# Patient Record
Sex: Female | Born: 1980 | Race: White | Hispanic: No | Marital: Single | Smoking: Former smoker
Health system: Southern US, Community
[De-identification: ages and names within clinical notes are randomized; demographics above are authoritative.]

## PROBLEM LIST (undated history)

## (undated) DIAGNOSIS — N200 Calculus of kidney: Secondary | ICD-10-CM

## (undated) HISTORY — DX: Calculus of kidney: N20.0

## (undated) HISTORY — PX: OTHER SURGICAL HISTORY: SHX169

---

## 2015-05-21 ENCOUNTER — Other Ambulatory Visit: Payer: Self-pay | Admitting: Family Medicine

## 2015-05-21 DIAGNOSIS — N6310 Unspecified lump in the right breast, unspecified quadrant: Secondary | ICD-10-CM

## 2015-05-28 ENCOUNTER — Ambulatory Visit
Admission: RE | Admit: 2015-05-28 | Discharge: 2015-05-28 | Disposition: A | Discharge status: Home / Self Care | Payer: Medicaid Other | Source: Ambulatory Visit | Attending: Family Medicine | Admitting: Family Medicine

## 2015-05-28 DIAGNOSIS — N6310 Unspecified lump in the right breast, unspecified quadrant: Secondary | ICD-10-CM

## 2017-01-11 ENCOUNTER — Ambulatory Visit: Payer: Medicaid Other | Admitting: Obstetrics and Gynecology

## 2019-10-10 ENCOUNTER — Telehealth: Payer: Self-pay | Admitting: Obstetrics and Gynecology

## 2019-10-10 NOTE — Telephone Encounter (Signed)
3/1 at 210 for mirena with ABC

## 2019-10-10 NOTE — Telephone Encounter (Signed)
Noted. Will order to arrive by apt date/time. 

## 2019-10-23 ENCOUNTER — Other Ambulatory Visit: Payer: Self-pay

## 2019-10-23 ENCOUNTER — Ambulatory Visit (INDEPENDENT_AMBULATORY_CARE_PROVIDER_SITE_OTHER): Payer: Medicaid Other | Admitting: Obstetrics and Gynecology

## 2019-10-23 ENCOUNTER — Encounter: Payer: Self-pay | Admitting: Obstetrics and Gynecology

## 2019-10-23 VITALS — BP 120/80 | Ht 63.0 in | Wt 132.0 lb

## 2019-10-23 DIAGNOSIS — Z30433 Encounter for removal and reinsertion of intrauterine contraceptive device: Secondary | ICD-10-CM | POA: Diagnosis not present

## 2019-10-23 HISTORY — PX: SHOULDER ARTHROSCOPY WITH ROTATOR CUFF REPAIR: SHX5685

## 2019-10-23 MED ORDER — MIRENA (52 MG) 20 MCG/24HR IU IUD: INTRAUTERINE_SYSTEM | Freq: Once | INTRAUTERINE | 0 refills | Status: AC

## 2019-10-23 NOTE — Telephone Encounter (Signed)
Mirena reserved for this patient. 

## 2019-10-23 NOTE — Progress Notes (Signed)
Patient, No Pcp Per    Chief Complaint  Patient presents with  . Contraception    HPI:      [name] is a 39 y.o. No obstetric history on file. who LMP was No LMP recorded., presents today for NP Mirena replacement. Mirena placed about 5+ yrs ago. Pt is amenorrheic, no dysmen, no dyspareunia. Would like another one. Did Paragard in past with lots of bleeding. Has pap smears/annuals with PCP. They recommended pt see GYN for IUD mgmt.    Past Medical History:  Diagnosis Date  . Kidney stones     Past Surgical History:  Procedure Laterality Date  . kidney stones      History reviewed. No pertinent family history.  Social History   Socioeconomic History  . Marital status: Single    Spouse name: Not on file  . Number of children: Not on file  . Years of education: Not on file  . Highest education level: Not on file  Occupational History  . Not on file  Tobacco Use  . Smoking status: Never Smoker  . Smokeless tobacco: Never Used  Substance and Sexual Activity  . Alcohol use: Yes  . Drug use: Never  . Sexual activity: Yes    Birth control/protection: I.U.D.  Other Topics Concern  . Not on file  Social History Narrative  . Not on file   Social Determinants of Health   Financial Resource Strain:   . Difficulty of Paying Living Expenses: Not on file  Food Insecurity:   . Worried About Programme researcher, broadcasting/film/video in the Last Year: Not on file  . Ran Out of Food in the Last Year: Not on file  Transportation Needs:   . Lack of Transportation (Medical): Not on file  . Lack of Transportation (Non-Medical): Not on file  Physical Activity:   . Days of Exercise per Week: Not on file  . Minutes of Exercise per Session: Not on file  Stress:   . Feeling of Stress : Not on file  Social Connections:   . Frequency of Communication with Friends and Family: Not on file  . Frequency of Social Gatherings with Friends and Family: Not on file  . Attends Religious Services:  Not on file  . Active Member of Clubs or Organizations: Not on file  . Attends Banker Meetings: Not on file  . Marital Status: Not on file  Intimate Partner Violence:   . Fear of Current or Ex-Partner: Not on file  . Emotionally Abused: Not on file  . Physically Abused: Not on file  . Sexually Abused: Not on file    Outpatient Medications Prior to Visit  Medication Sig Dispense Refill  . amphetamine-dextroamphetamine (ADDERALL XR) 25 MG 24 hr capsule Adderall XR 25 mg capsule,extended release  TAKE 1 CAPSULE BY MOUTH EVERY MORNING    . levonorgestrel (MIRENA, 52 MG,) 20 MCG/24HR IUD Mirena 20 mcg/24 hours (6 yrs) 52 mg intrauterine device  Take by intrauterine route.     No facility-administered medications prior to visit.      ROS:  Review of Systems  Constitutional: Negative for fatigue, fever and unexpected weight change.  Respiratory: Negative for cough, shortness of breath and wheezing.   Cardiovascular: Negative for chest pain, palpitations and leg swelling.  Gastrointestinal: Negative for blood in stool, constipation, diarrhea, nausea and vomiting.  Endocrine: Negative for cold intolerance, heat intolerance and polyuria.  Genitourinary: Negative for dyspareunia, dysuria, flank pain, frequency, genital sores,  hematuria, menstrual problem, pelvic pain, urgency, vaginal bleeding, vaginal discharge and vaginal pain.  Musculoskeletal: Negative for back pain, joint swelling and myalgias.  Skin: Negative for rash.  Neurological: Negative for dizziness, syncope, light-headedness, numbness and headaches.  Hematological: Negative for adenopathy.  Psychiatric/Behavioral: Negative for agitation, confusion, sleep disturbance and suicidal ideas. The patient is not nervous/anxious.    OBJECTIVE:   Vitals:  BP 120/80   Ht 5\' 3"  (1.6 m)   Wt 132 lb (59.9 kg)   BMI 23.38 kg/m   Physical Exam Vitals reviewed.  Constitutional:      Appearance: She is well-developed.   Pulmonary:     Effort: Pulmonary effort is normal.  Genitourinary:    General: Normal vulva.     Pubic Area: No rash.      Labia:        Right: No rash, tenderness or lesion.        Left: No rash, tenderness or lesion.      Vagina: Normal. No vaginal discharge, erythema or tenderness.     Cervix: Normal.     Uterus: Normal. Not enlarged and not tender.      Adnexa: Right adnexa normal and left adnexa normal.       Right: No mass or tenderness.         Left: No mass or tenderness.       Comments: IUD STRINGS IN CX OS Musculoskeletal:        General: Normal range of motion.     Cervical back: Normal range of motion.  Skin:    General: Skin is warm and dry.  Neurological:     General: No focal deficit present.     Mental Status: She is alert and oriented to person, place, and time.  Psychiatric:        Mood and Affect: Mood normal.        Behavior: Behavior normal.        Thought Content: Thought content normal.        Judgment: Judgment normal.   IUD Removal Strings of IUD identified and grasped.  IUD removed without problem with ring forceps.  Pt tolerated this well.  IUD noted to be intact.  IUD Insertion Procedure Note Patient identified, informed consent performed, consent signed.   Discussed risks of irregular bleeding, cramping, infection, malpositioning or misplacement of the IUD outside the uterus which may require further procedure such as laparoscopy, risk of failure <1%. Time out was performed.    Speculum placed in the vagina.  Cervix visualized.  Cleaned with Betadine x 2.  Grasped anteriorly with a single tooth tenaculum.  Uterus sounded to 9.0 cm.   IUD placed per manufacturer's recommendations.  Strings trimmed to 3 cm. Tenaculum was removed, good hemostasis noted.  Patient tolerated procedure well.   Assessment/Plan: Encounter for removal and reinsertion of intrauterine contraceptive device (IUD) - Plan: levonorgestrel (MIRENA, 52 MG,) 20 MCG/24HR IUD   Meds  ordered this encounter  Medications  . levonorgestrel (MIRENA, 52 MG,) 20 MCG/24HR IUD    Sig: by Intrauterine route once for 1 dose.    Dispense:  1 each    Refill:  0    Order Specific Question:   Supervising Provider    Answer:   Nadara Mustard   Patient was given post-procedure instructions.  She was advised to have backup contraception for one week.   Call if you are having increasing pain, cramps or bleeding or if you have a  fever greater than 100.4 degrees F., shaking chills, nausea or vomiting. Patient was also asked to check IUD strings periodically and follow up in 4 weeks for IUD check.    Return in about 4 weeks (around 11/20/2019) for IUD f/u.  Loye Vento B. Ameli Sangiovanni, PA-C 10/23/2019 2:34 PM

## 2019-10-23 NOTE — Patient Instructions (Addendum)
I value your feedback and entrusting us with your care. If you get a [facility] patient survey, I would appreciate you taking the time to let us know about your experience today. Thank you!  As of August 03, 2019, your lab results will be released to your MyChart immediately, before I even have a chance to see them. Please give me time to review them and contact you if there are any abnormalities. Thank you for your patience.   [facility] [phone]  Instructions after IUD insertion  Most women experience no significant problems after insertion of an IUD, however minor cramping and spotting for a few days is common. Cramps may be treated with ibuprofen 800mg every 8 hours or Tylenol 650 mg every 4 hours. Contact [facility] immediately if you experience any of the following symptoms during the next week: temperature >99.6 degrees, worsening pelvic pain, abdominal pain, fainting, unusually heavy vaginal bleeding, foul vaginal discharge, or if you think you have expelled the IUD.  Nothing inserted in the vagina for 48 hours. You will be scheduled for a follow up visit in approximately four weeks.  You should check monthly to be sure you can feel the IUD strings in the upper vagina. If you are having a monthly period, try to check after each period. If you cannot feel the IUD strings,  contact [facility] immediately so we can do an exam to determine if the IUD has been expelled.   Please use backup protection until we can confirm the IUD is in place.  Call [facility] if you are exposed to or diagnosed with a sexually transmitted infection, as we will need to discuss whether it is safe for you to continue using an IUD.   

## 2019-11-20 ENCOUNTER — Ambulatory Visit (INDEPENDENT_AMBULATORY_CARE_PROVIDER_SITE_OTHER): Payer: Medicaid Other | Admitting: Obstetrics and Gynecology

## 2019-11-20 ENCOUNTER — Other Ambulatory Visit: Payer: Self-pay

## 2019-11-20 ENCOUNTER — Encounter: Payer: Self-pay | Admitting: Obstetrics and Gynecology

## 2019-11-20 VITALS — BP 104/80 | Ht 62.0 in | Wt 134.0 lb

## 2019-11-20 DIAGNOSIS — Z30431 Encounter for routine checking of intrauterine contraceptive device: Secondary | ICD-10-CM

## 2019-11-20 NOTE — Patient Instructions (Signed)
I value your feedback and entrusting us with your care. If you get a  patient survey, I would appreciate you taking the time to let us know about your experience today. Thank you!  As of August 03, 2019, your lab results will be released to your MyChart immediately, before I even have a chance to see them. Please give me time to review them and contact you if there are any abnormalities. Thank you for your patience.  

## 2019-11-20 NOTE — Progress Notes (Signed)
   Chief Complaint  Patient presents with  . IUD check    no concerns     History of Present Illness:  Cassandra Benitez is a 39 y.o. that had a Mirena IUD REplaced approximately 1 month ago. Since that time, she denies dyspareunia, pelvic pain, non-menstrual bleeding, vaginal d/c, heavy bleeding. Doing very well.   Review of Systems  Constitutional: Negative for fever.  Gastrointestinal: Negative for blood in stool, constipation, diarrhea, nausea and vomiting.  Genitourinary: Negative for dyspareunia, dysuria, flank pain, frequency, hematuria, urgency, vaginal bleeding, vaginal discharge and vaginal pain.  Musculoskeletal: Negative for back pain.  Skin: Negative for rash.    Physical Exam:  BP 104/80   Ht 5\' 2"  (1.575 m)   Wt 134 lb (60.8 kg)   BMI 24.51 kg/m  Body mass index is 24.51 kg/m.  Pelvic exam:  Two IUD strings present seen coming from the cervical os. EGBUS, vaginal vault and cervix: within normal limits   Assessment:   Encounter for routine checking of intrauterine contraceptive device (IUD)  IUD strings present in proper location; pt doing well  Plan: F/u if any signs of infection or can no longer feel the strings.   Alicia B. Copland, PA-C 11/20/2019 2:42 PM

## 2019-12-31 ENCOUNTER — Other Ambulatory Visit: Payer: Self-pay

## 2019-12-31 ENCOUNTER — Encounter (HOSPITAL_COMMUNITY): Payer: Self-pay

## 2019-12-31 DIAGNOSIS — K824 Cholesterolosis of gallbladder: Secondary | ICD-10-CM | POA: Diagnosis present

## 2019-12-31 DIAGNOSIS — D649 Anemia, unspecified: Secondary | ICD-10-CM | POA: Diagnosis present

## 2019-12-31 DIAGNOSIS — E876 Hypokalemia: Secondary | ICD-10-CM | POA: Diagnosis present

## 2019-12-31 DIAGNOSIS — F909 Attention-deficit hyperactivity disorder, unspecified type: Secondary | ICD-10-CM | POA: Diagnosis present

## 2019-12-31 DIAGNOSIS — J189 Pneumonia, unspecified organism: Secondary | ICD-10-CM | POA: Diagnosis present

## 2019-12-31 DIAGNOSIS — A403 Sepsis due to Streptococcus pneumoniae: Principal | ICD-10-CM | POA: Diagnosis present

## 2019-12-31 DIAGNOSIS — Z79899 Other long term (current) drug therapy: Secondary | ICD-10-CM

## 2019-12-31 DIAGNOSIS — E8770 Fluid overload, unspecified: Secondary | ICD-10-CM | POA: Diagnosis not present

## 2019-12-31 DIAGNOSIS — R739 Hyperglycemia, unspecified: Secondary | ICD-10-CM | POA: Diagnosis present

## 2019-12-31 DIAGNOSIS — E872 Acidosis: Secondary | ICD-10-CM | POA: Diagnosis present

## 2019-12-31 DIAGNOSIS — Z20822 Contact with and (suspected) exposure to covid-19: Secondary | ICD-10-CM | POA: Diagnosis present

## 2019-12-31 DIAGNOSIS — Z7289 Other problems related to lifestyle: Secondary | ICD-10-CM

## 2019-12-31 DIAGNOSIS — Z87442 Personal history of urinary calculi: Secondary | ICD-10-CM

## 2019-12-31 DIAGNOSIS — Z716 Tobacco abuse counseling: Secondary | ICD-10-CM

## 2019-12-31 DIAGNOSIS — Z975 Presence of (intrauterine) contraceptive device: Secondary | ICD-10-CM

## 2019-12-31 DIAGNOSIS — J9601 Acute respiratory failure with hypoxia: Secondary | ICD-10-CM | POA: Diagnosis present

## 2019-12-31 LAB — CBC
HCT: 40.4 % (ref 36.0–46.0)
Hemoglobin: 13.5 g/dL (ref 12.0–15.0)
MCH: 31.3 pg (ref 26.0–34.0)
MCHC: 33.4 g/dL (ref 30.0–36.0)
MCV: 93.5 fL (ref 80.0–100.0)
Platelets: 194 10*3/uL (ref 150–400)
RBC: 4.32 MIL/uL (ref 3.87–5.11)
RDW: 13 % (ref 11.5–15.5)
WBC: 28.2 10*3/uL — ABNORMAL HIGH (ref 4.0–10.5)
nRBC: 0 % (ref 0.0–0.2)

## 2019-12-31 LAB — URINALYSIS, ROUTINE W REFLEX MICROSCOPIC
Bilirubin Urine: NEGATIVE
Glucose, UA: NEGATIVE mg/dL
Ketones, ur: NEGATIVE mg/dL
Nitrite: NEGATIVE
Protein, ur: 30 mg/dL — AB
Specific Gravity, Urine: 1.024 (ref 1.005–1.030)
pH: 5 (ref 5.0–8.0)

## 2019-12-31 LAB — COMPREHENSIVE METABOLIC PANEL
ALT: 18 U/L (ref 0–44)
AST: 17 U/L (ref 15–41)
Albumin: 4.1 g/dL (ref 3.5–5.0)
Alkaline Phosphatase: 66 U/L (ref 38–126)
Anion gap: 10 (ref 5–15)
BUN: 13 mg/dL (ref 6–20)
CO2: 23 mmol/L (ref 22–32)
Calcium: 9.2 mg/dL (ref 8.9–10.3)
Chloride: 101 mmol/L (ref 98–111)
Creatinine, Ser: 0.87 mg/dL (ref 0.44–1.00)
GFR calc Af Amer: >60 mL/min (ref >60)
GFR calc non Af Amer: >60 mL/min (ref >60)
Glucose, Bld: 109 mg/dL — ABNORMAL HIGH (ref 70–99)
Potassium: 3.8 mmol/L (ref 3.5–5.1)
Sodium: 134 mmol/L — ABNORMAL LOW (ref 135–145)
Total Bilirubin: 0.8 mg/dL (ref 0.3–1.2)
Total Protein: 7.1 g/dL (ref 6.5–8.1)

## 2019-12-31 LAB — HCG, QUANTITATIVE, PREGNANCY: hCG, Beta Chain, Quant, S: <1 m[IU]/mL (ref <5)

## 2019-12-31 LAB — LIPASE, BLOOD: Lipase: 19 U/L (ref 11–51)

## 2019-12-31 NOTE — ED Triage Notes (Signed)
Pt reports RUQ abdominal pain that started this morning. Pt reports fever of 102 today and took Tylenol and Naproxen since then. Pt is afebrile in triage. Pt reports drinking 7 beers about every 2-3 days. Pt states her boyfriend thinks she has pancreatitis.

## 2020-01-01 ENCOUNTER — Emergency Department (HOSPITAL_COMMUNITY): Payer: Medicaid Other

## 2020-01-01 ENCOUNTER — Inpatient Hospital Stay (HOSPITAL_COMMUNITY)
Admission: EM | Admit: 2020-01-01 | Discharge: 2020-01-04 | DRG: 871 | Disposition: A | Discharge status: Home / Self Care | Payer: Medicaid Other | Attending: Family Medicine | Admitting: Family Medicine

## 2020-01-01 DIAGNOSIS — B955 Unspecified streptococcus as the cause of diseases classified elsewhere: Secondary | ICD-10-CM | POA: Diagnosis not present

## 2020-01-01 DIAGNOSIS — K824 Cholesterolosis of gallbladder: Secondary | ICD-10-CM | POA: Diagnosis present

## 2020-01-01 DIAGNOSIS — F909 Attention-deficit hyperactivity disorder, unspecified type: Secondary | ICD-10-CM | POA: Diagnosis present

## 2020-01-01 DIAGNOSIS — Z20822 Contact with and (suspected) exposure to covid-19: Secondary | ICD-10-CM | POA: Diagnosis present

## 2020-01-01 DIAGNOSIS — J9601 Acute respiratory failure with hypoxia: Secondary | ICD-10-CM | POA: Diagnosis present

## 2020-01-01 DIAGNOSIS — R1011 Right upper quadrant pain: Secondary | ICD-10-CM | POA: Diagnosis not present

## 2020-01-01 DIAGNOSIS — Z975 Presence of (intrauterine) contraceptive device: Secondary | ICD-10-CM | POA: Diagnosis not present

## 2020-01-01 DIAGNOSIS — Z716 Tobacco abuse counseling: Secondary | ICD-10-CM | POA: Diagnosis not present

## 2020-01-01 DIAGNOSIS — R0602 Shortness of breath: Secondary | ICD-10-CM

## 2020-01-01 DIAGNOSIS — J9621 Acute and chronic respiratory failure with hypoxia: Secondary | ICD-10-CM | POA: Diagnosis not present

## 2020-01-01 DIAGNOSIS — J189 Pneumonia, unspecified organism: Secondary | ICD-10-CM | POA: Diagnosis present

## 2020-01-01 DIAGNOSIS — Z79899 Other long term (current) drug therapy: Secondary | ICD-10-CM | POA: Diagnosis not present

## 2020-01-01 DIAGNOSIS — Z7289 Other problems related to lifestyle: Secondary | ICD-10-CM | POA: Diagnosis not present

## 2020-01-01 DIAGNOSIS — Z87442 Personal history of urinary calculi: Secondary | ICD-10-CM | POA: Diagnosis not present

## 2020-01-01 DIAGNOSIS — A403 Sepsis due to Streptococcus pneumoniae: Secondary | ICD-10-CM | POA: Diagnosis present

## 2020-01-01 DIAGNOSIS — E8770 Fluid overload, unspecified: Secondary | ICD-10-CM | POA: Diagnosis not present

## 2020-01-01 DIAGNOSIS — E872 Acidosis: Secondary | ICD-10-CM | POA: Diagnosis present

## 2020-01-01 DIAGNOSIS — R7881 Bacteremia: Secondary | ICD-10-CM | POA: Diagnosis not present

## 2020-01-01 DIAGNOSIS — D649 Anemia, unspecified: Secondary | ICD-10-CM | POA: Diagnosis present

## 2020-01-01 DIAGNOSIS — R739 Hyperglycemia, unspecified: Secondary | ICD-10-CM | POA: Diagnosis present

## 2020-01-01 DIAGNOSIS — A419 Sepsis, unspecified organism: Secondary | ICD-10-CM | POA: Diagnosis not present

## 2020-01-01 DIAGNOSIS — E876 Hypokalemia: Secondary | ICD-10-CM | POA: Diagnosis present

## 2020-01-01 LAB — BLOOD CULTURE ID PANEL (REFLEXED)

## 2020-01-01 LAB — LACTIC ACID, PLASMA: Lactic Acid, Venous: 1.7 mmol/L (ref 0.5–1.9)

## 2020-01-01 LAB — URINE CULTURE: Culture: NO GROWTH

## 2020-01-01 LAB — RESPIRATORY PANEL BY RT PCR (FLU A&B, COVID)
Influenza A by PCR: NEGATIVE
Influenza B by PCR: NEGATIVE
SARS Coronavirus 2 by RT PCR: NEGATIVE

## 2020-01-01 MED ORDER — ACETAMINOPHEN 650 MG RE SUPP: 650.0000 mg | Freq: Four times a day (QID) | RECTAL | Status: DC | PRN

## 2020-01-01 MED ORDER — SODIUM CHLORIDE 0.9% FLUSH
3.0000 mL | Freq: Two times a day (BID) | INTRAVENOUS | Status: DC
  Administered 2020-01-01 – 2020-01-04 (×4): 3 mL via INTRAVENOUS

## 2020-01-01 MED ORDER — SODIUM CHLORIDE 0.9 % IV SOLN
2.0000 g | INTRAVENOUS | Status: DC
  Administered 2020-01-02 – 2020-01-03 (×2): 2 g via INTRAVENOUS
  Filled 2020-01-01 (×2): qty 2

## 2020-01-01 MED ORDER — FENTANYL CITRATE (PF) 100 MCG/2ML IJ SOLN
100.0000 ug | Freq: Once | INTRAMUSCULAR | Status: AC
  Administered 2020-01-01: 02:00:00 100 ug via INTRAVENOUS
  Filled 2020-01-01: qty 2

## 2020-01-01 MED ORDER — IOHEXOL 300 MG/ML  SOLN
100.0000 mL | Freq: Once | INTRAMUSCULAR | Status: AC | PRN
  Administered 2020-01-01: 100 mL via INTRAVENOUS

## 2020-01-01 MED ORDER — POLYETHYLENE GLYCOL 3350 17 G PO PACK: 17.0000 g | PACK | Freq: Every day | ORAL | Status: DC | PRN

## 2020-01-01 MED ORDER — LACTATED RINGERS IV BOLUS (SEPSIS)
500.0000 mL | Freq: Once | INTRAVENOUS | Status: AC
  Administered 2020-01-01: 500 mL via INTRAVENOUS

## 2020-01-01 MED ORDER — ENOXAPARIN SODIUM 40 MG/0.4ML ~~LOC~~ SOLN
40.0000 mg | SUBCUTANEOUS | Status: DC
  Administered 2020-01-01 – 2020-01-03 (×3): 40 mg via SUBCUTANEOUS
  Filled 2020-01-01 (×3): qty 0.4

## 2020-01-01 MED ORDER — ACETAMINOPHEN 325 MG PO TABS
650.0000 mg | ORAL_TABLET | Freq: Four times a day (QID) | ORAL | Status: DC | PRN
  Administered 2020-01-01 – 2020-01-03 (×5): 650 mg via ORAL
  Filled 2020-01-01 (×5): qty 2

## 2020-01-01 MED ORDER — AZITHROMYCIN 250 MG PO TABS
500.0000 mg | ORAL_TABLET | Freq: Once | ORAL | Status: AC
  Administered 2020-01-01: 03:00:00 500 mg via ORAL
  Filled 2020-01-01: qty 2

## 2020-01-01 MED ORDER — LACTATED RINGERS IV BOLUS (SEPSIS)
250.0000 mL | Freq: Once | INTRAVENOUS | Status: AC
  Administered 2020-01-01: 250 mL via INTRAVENOUS

## 2020-01-01 MED ORDER — AMPHETAMINE-DEXTROAMPHET ER 5 MG PO CP24: 25.0000 mg | ORAL_CAPSULE | ORAL | Status: DC

## 2020-01-01 MED ORDER — GUAIFENESIN 100 MG/5ML PO SOLN
5.0000 mL | Freq: Once | ORAL | Status: AC
  Administered 2020-01-01: 100 mg via ORAL
  Filled 2020-01-01: qty 10

## 2020-01-01 MED ORDER — KETOROLAC TROMETHAMINE 15 MG/ML IJ SOLN
15.0000 mg | Freq: Four times a day (QID) | INTRAMUSCULAR | Status: AC | PRN
  Administered 2020-01-01 (×2): 15 mg via INTRAVENOUS
  Filled 2020-01-01 (×2): qty 1

## 2020-01-01 MED ORDER — GUAIFENESIN ER 600 MG PO TB12
1200.0000 mg | ORAL_TABLET | Freq: Two times a day (BID) | ORAL | Status: DC
  Administered 2020-01-01 – 2020-01-04 (×6): 1200 mg via ORAL
  Filled 2020-01-01 (×7): qty 2

## 2020-01-01 MED ORDER — KETOROLAC TROMETHAMINE 30 MG/ML IJ SOLN
30.0000 mg | Freq: Once | INTRAMUSCULAR | Status: AC
  Administered 2020-01-01: 30 mg via INTRAVENOUS
  Filled 2020-01-01: qty 1

## 2020-01-01 MED ORDER — SODIUM CHLORIDE 0.9 % IV BOLUS (SEPSIS)
1000.0000 mL | Freq: Once | INTRAVENOUS | Status: AC
  Administered 2020-01-01: 1000 mL via INTRAVENOUS

## 2020-01-01 MED ORDER — GUAIFENESIN 100 MG/5ML PO SOLN
5.0000 mL | ORAL | Status: DC | PRN
  Administered 2020-01-01 – 2020-01-03 (×4): 100 mg via ORAL
  Filled 2020-01-01 (×4): qty 10

## 2020-01-01 MED ORDER — SODIUM CHLORIDE 0.9 % IV SOLN: 500.0000 mg | INTRAVENOUS | Status: DC

## 2020-01-01 MED ORDER — SODIUM CHLORIDE 0.9 % IV SOLN: INTRAVENOUS | Status: AC

## 2020-01-01 MED ORDER — SODIUM CHLORIDE (PF) 0.9 % IJ SOLN
INTRAMUSCULAR | Status: AC
  Filled 2020-01-01: qty 50

## 2020-01-01 MED ORDER — ALBUTEROL SULFATE (2.5 MG/3ML) 0.083% IN NEBU: 2.5000 mg | INHALATION_SOLUTION | RESPIRATORY_TRACT | Status: DC | PRN

## 2020-01-01 MED ORDER — SODIUM CHLORIDE 0.9 % IV SOLN
2.0000 g | Freq: Once | INTRAVENOUS | Status: AC
  Administered 2020-01-01: 02:00:00 2 g via INTRAVENOUS
  Filled 2020-01-01: qty 20

## 2020-01-01 MED ORDER — METRONIDAZOLE IN NACL 5-0.79 MG/ML-% IV SOLN
500.0000 mg | Freq: Once | INTRAVENOUS | Status: AC
  Administered 2020-01-01: 03:00:00 500 mg via INTRAVENOUS
  Filled 2020-01-01: qty 100

## 2020-01-01 MED ORDER — LACTATED RINGERS IV BOLUS (SEPSIS)
1000.0000 mL | Freq: Once | INTRAVENOUS | Status: AC
  Administered 2020-01-01: 1000 mL via INTRAVENOUS

## 2020-01-01 MED ORDER — FENTANYL CITRATE (PF) 100 MCG/2ML IJ SOLN
100.0000 ug | Freq: Once | INTRAMUSCULAR | Status: AC
  Administered 2020-01-01: 05:00:00 100 ug via INTRAVENOUS
  Filled 2020-01-01: qty 2

## 2020-01-01 NOTE — H&P (Addendum)
History and Physical    Cassandra Benitez OBS:[id number] DOB: Sep 01, 1980 DOA: 01/01/2020  PCP: Lance Bosch, NP   I have briefly reviewed patients previous medical reports in [facility].  Patient coming from: Home  Chief Complaint: Right lower chest pain, productive cough, fever and body aches.  HPI: Cassandra Benitez is a 39 year old female, lives with family, does not currently work, independent, PMH of kidney stones that have required intervention in the past, ADHD but no other PMH, presented to the [facility] ED on 12/31/2019 due to above complaints.  Week prior to admission, she was ill with 3 days of URI symptoms that she caught from her 48-year-old child, recovered.  She received second dose of her Pfizer COVID-19 vaccine on 12/29/2019.  Since morning of 12/31/2019, she started experiencing right lower anterior chest pain which started off as mild to moderate then progressively got worse, radiating to right side of back, worse with deep inspiration or coughing, some relief with medications in the ED.  This was associated with productive cough with cream-colored phlegm, no blood.  Fevers as high as 102.6 at home.  Headache and generalized body aches.  She had some cramping right upper abdominal pain which she feels was due to her coughing which has since subsided.  Denies history of recent long distance travel.  Low index of suspicion for VTE at this time but if hypoxia persists despite recovery from pneumonia then may consider this given history of IUD.  ED Course: Temperature of 99.9, tachypneic in the 20s, tachycardia in the 100-1 tens, soft blood pressures in the 90s/50s-70s, mildly hypoxic with oxygen saturation in the low 90s.  Lab work significant for sodium of 134 otherwise CMP unremarkable.  WBC 28.2.  Lactate 1.7.  Beta hCG quantitative <1.  Flu and COVID-19 RT-PCR negative.  Urine microscopy shows rare bacteria, 30 protein, 21-50 white blood cell.  Blood and urine cultures  pending.  Chest x-ray confirms right middle lobe pneumonia. RUQ ultrasound: A 4 mm gallbladder polyp, otherwise unremarkable RUQ ultrasound.  Follow-up in 6 to 12 months recommended. CT abdomen and pelvis with contrast: Multifocal pneumonia (right middle lobe consolidation.  Patchy central consolidation in both lower lobes).  Trace pericholecystic fluid, likely incidental given otherwise normal RUQ ultrasound.  EDP evaluated initially for possible abdominal/gallbladder etiology but eventually turned out to be pneumonia. She was treated per sepsis protocol with IV fluids, IV ceftriaxone and oral azithromycin.  States that she feels about the same.  Review of Systems:  All other systems reviewed and apart from HPI, are negative.  Past Medical History:  Diagnosis Date  . Kidney stones     Past Surgical History:  Procedure Laterality Date  . kidney stones    . SHOULDER ARTHROSCOPY WITH ROTATOR CUFF REPAIR  10/2019    Social History  reports that she has never smoked. She has never used smokeless tobacco. She reports current alcohol use. She reports that she does not use drugs.  Patient waves, last done on 5/9.  She drinks a sixpack of beers up to 3 times per week.  No Known Allergies  History reviewed. No pertinent family history.  Her 11-year-old daughter had URI symptoms last week.   Prior to Admission medications   Medication Sig Start Date End Date Taking? Authorizing Provider  acetaminophen (TYLENOL) 325 MG tablet Take 325 mg by mouth every 6 (six) hours as needed for mild pain or headache.   Yes [provider]  amphetamine-dextroamphetamine (ADDERALL XR) 25 MG  24 hr capsule Take 25 mg by mouth every morning.    Yes [provider]  levonorgestrel (MIRENA, 52 MG,) 20 MCG/24HR IUD by Intrauterine route once for 1 dose. 10/23/19 01/01/20 Yes CoplandIlona Sorrel, PA-C    Physical Exam: Vitals:   01/01/20 0700 01/01/20 0730 01/01/20 0800 01/01/20 0851  BP: 99/70  100/65 102/68   Pulse: (!) 113 (!) 112 (!) 118   Resp: (!) 25 (!) 26 (!) 24   Temp:    99.4 F (37.4 C)  TempSrc:    Oral  SpO2: 90% 91% 90%   Weight:      Height:       Patient was examined along with the female RN as chaperone in the room.   Constitutional: Pleasant young female, moderately built and nourished, slightly ill looking lying propped up in bed. Eyes: PERTLA, lids and conjunctivae normal ENMT: Mucous membranes are dry. Posterior pharynx clear of any exudate or lesions. Normal dentition.  Neck: supple, no masses, no thyromegaly Respiratory: Bronchial breath sounds bilateral bases, right greater than sign left with occasional crackles.  No wheezing or rhonchi.  Rest of lung fields clear to auscultation.  Mild tachypnea. Cardiovascular: S1 & S2 heard, mild regular tachycardia. No JVD, murmurs, rubs or clicks. No pedal edema. Abdomen: Non distended. Non tender. Soft. No organomegaly or masses appreciated. No clinical Ascites. Normal bowel sounds heard. Musculoskeletal: no clubbing / cyanosis. No joint deformity upper and lower extremities. Good ROM, no contractures. Normal muscle tone.  Skin: no rashes, lesions, ulcers. No induration Neurologic: CN 2-12 grossly intact. Sensation intact, DTR normal. Strength 5/5 in all 4 limbs.  Psychiatric: Normal judgment and insight. Alert and oriented x 3. Normal mood.     Labs on Admission: I have personally reviewed following labs and imaging studies  CBC: Recent Labs  Lab 12/31/19 2246  WBC 28.2*  HGB 13.5  HCT 40.4  MCV 93.5  PLT 194    Basic Metabolic Panel: Recent Labs  Lab 12/31/19 2246  NA 134*  K 3.8  CL 101  CO2 23  GLUCOSE 109*  BUN 13  CREATININE 0.87  CALCIUM 9.2    Liver Function Tests: Recent Labs  Lab 12/31/19 2246  AST 17  ALT 18  ALKPHOS 66  BILITOT 0.8  PROT 7.1  ALBUMIN 4.1    Urine analysis:    Component Value Date/Time   COLORURINE AMBER (A) 12/31/2019 2246   APPEARANCEUR CLOUDY  (A) 12/31/2019 2246   LABSPEC 1.024 12/31/2019 2246   PHURINE 5.0 12/31/2019 2246   GLUCOSEU NEGATIVE 12/31/2019 2246   HGBUR SMALL (A) 12/31/2019 2246   BILIRUBINUR NEGATIVE 12/31/2019 2246   KETONESUR NEGATIVE 12/31/2019 2246   PROTEINUR 30 (A) 12/31/2019 2246   NITRITE NEGATIVE 12/31/2019 2246   LEUKOCYTESUR SMALL (A) 12/31/2019 2246     Radiological Exams on Admission: CT ABDOMEN PELVIS W CONTRAST  Result Date: 01/01/2020 CLINICAL DATA:  Right upper quadrant pain. EXAM: CT ABDOMEN AND PELVIS WITH CONTRAST TECHNIQUE: Multidetector CT imaging of the abdomen and pelvis was performed using the standard protocol following bolus administration of intravenous contrast. CONTRAST:  OMNIPAQUE IOHEXOL 300 MG/ML  SOLN COMPARISON:  Right upper quadrant ultrasound from same day. FINDINGS: Lower chest: Right middle lobe consolidation. Patchy central consolidations in both lower lobes. Hepatobiliary: No focal liver abnormality is seen. Trace pericholecystic fluid. No gallstones, gallbladder wall thickening, or biliary dilatation. Pancreas: Unremarkable. No pancreatic ductal dilatation or surrounding inflammatory changes. Spleen: Normal in size without focal  abnormality. Adrenals/Urinary Tract: Adrenal glands are unremarkable. Kidneys are normal, without renal calculi, focal lesion, or hydronephrosis. Bladder is decompressed. Stomach/Bowel: Stomach is within normal limits. Appendix appears normal. No evidence of bowel wall thickening, distention, or inflammatory changes. Vascular/Lymphatic: No significant vascular findings are present. No enlarged abdominal or pelvic lymph nodes. Reproductive: Uterus and bilateral adnexa are unremarkable. IUD within the endometrial canal. Other: Trace free fluid in the pelvis, likely physiologic. No pneumoperitoneum. Musculoskeletal: No acute or significant osseous findings. IMPRESSION: 1. Multifocal pneumonia. 2. Trace pericholecystic fluid, likely incidental given  otherwise normal right upper quadrant ultrasound from earlier this morning. Electronically Signed   By: Titus Dubin M.D.   On: 01/01/2020 05:49   DG Chest Port 1 View  Result Date: 01/01/2020 CLINICAL DATA:  39 year old female with cough. EXAM: PORTABLE CHEST 1 VIEW COMPARISON:  None FINDINGS: Focal right middle lobe density with silhouetting of the right cardiac border. The left lung is clear. No pleural effusion or pneumothorax. The cardiac silhouette is within limits. No acute osseous pathology. IMPRESSION: Right middle lobe pneumonia. Electronically Signed   By: Anner Crete M.D.   On: 01/01/2020 01:37   US Abdomen Limited RUQ  Result Date: 01/01/2020 CLINICAL DATA:  39 year old female with right upper quadrant abdominal pain. EXAM: ULTRASOUND ABDOMEN LIMITED RIGHT UPPER QUADRANT COMPARISON:  None. FINDINGS: Gallbladder: There is no gallstone, gallbladder wall thickening, or pericholecystic fluid. Negative sonographic Murphy's sign. There is a 4 mm gallbladder polyp. Common bile duct: Diameter: 2 mm Liver: No focal lesion identified. Within normal limits in parenchymal echogenicity. Portal vein is patent on color Doppler imaging with normal direction of blood flow towards the liver. Other: None. IMPRESSION: A 4 mm gallbladder polyp, otherwise unremarkable right upper quadrant ultrasound. Follow-up in 6-12 months recommended. Electronically Signed   By: Anner Crete M.D.   On: 01/01/2020 02:18    EKG: Independently reviewed.  Sinus tachycardia at 116 bpm, normal axis, no acute findings and QTC of 438 ms.  Assessment/Plan Principal Problem:   Sepsis due to pneumonia [facility]) Active Problems:   CAP (community acquired pneumonia)   ADHD   Acute on chronic respiratory failure with hypoxia (HCC)     Sepsis, POA, due to lobar/community-acquired pneumonia: Treated per sepsis protocol with aggressive IV fluids, IV ceftriaxone and azithromycin, continue.  Follow blood and urine culture  results.  Treat supportively with antipyretics, incentive spirometry, oxygen support, antipyretics/NSAIDs.  Counseled regarding quitting vaping and she verbalized understanding.  Acute respiratory failure with hypoxia: Secondary to pneumonia.  Oxygen support.  Need to determine oxygen needs prior to discharge.  ADHD: Continue home dose of Adderall.  Stable.  Vaping: Cessation counseled.  Alcohol use: Mild.  If develops any withdrawal symptoms or signs then may consider CIWA protocol.  Leukocytosis: Secondary to sepsis from pneumonia.  Trend daily CBC.   DVT prophylaxis: Lovenox Code Status: Full Family Communication: None at bedside Disposition Plan:   Patient is from:  Home  Anticipated DC to:  Home  Anticipated DC date:  To be determined, possibly 2 to 3 days  Anticipated DC barriers: Improvement from sepsis related to pneumonia and from hypoxia.   Consults called: None Admission status: Inpatient/telemetry  Severity of Illness: The appropriate patient status for this patient is INPATIENT. Inpatient status is judged to be reasonable and necessary in order to provide the required intensity of service to ensure the patient's safety. The patient's presenting symptoms, physical exam findings, and initial radiographic and laboratory data in the context of their  chronic comorbidities is felt to place them at high risk for further clinical deterioration. Furthermore, it is not anticipated that the patient will be medically stable for discharge from the hospital within 2 midnights of admission. The following factors support the patient status of inpatient.   " The patient's presenting symptoms include right lower pleuritic chest pain, productive cough, fever, myalgia and headache. " The worrisome physical exam findings include fever, tachycardia, tachypnea, hypoxia, bronchial breath sounds in lower lobes, right more than left. " The initial radiographic and laboratory data are worrisome  because of CT abdomen and chest x-ray suggestive of pneumonia, marked leukocytosis. " The chronic co-morbidities include ADHD.   * I certify that at the point of admission it is my clinical judgment that the patient will require inpatient hospital care spanning beyond 2 midnights from the point of admission due to high intensity of service, high risk for further deterioration and high frequency of surveillance required.Marcellus Scott MD [facility]  To contact the attending provider between 7A-7P or the covering provider during after hours 7P-7A, please log into the web site [email or web address] and access using universal [place] password for that web site. If you do not have the password, please call the hospital operator.  01/01/2020, 8:59 AM

## 2020-01-01 NOTE — Progress Notes (Signed)
PHARMACY - PHYSICIAN COMMUNICATION CRITICAL VALUE ALERT - BLOOD CULTURE IDENTIFICATION (BCID)  Cassandra Benitez is an 39 y.o. female who presented to [facility] on 01/01/2020 with a chief complaint of  Right lower chest pain, productive cough, fever and body aches  Assessment: PNA, strep pneumo bacteremia  Name of physician (or Provider) Contacted: Modena Nunnery via [facility]  Current antibiotics: ceftriaxone 2 gm IV q24, azithromycin 500 mg IV q24  Changes to prescribed antibiotics recommended:  rec continue ceftriaxone 2 gm q24 and stop azithromycin  Results for orders placed or performed during the hospital encounter of 01/01/20  Blood Culture ID Panel (Reflexed) (Collected: 01/01/2020  1:51 AM)  Result Value Ref Range   Enterococcus species NOT DETECTED NOT DETECTED   Listeria monocytogenes NOT DETECTED NOT DETECTED   Staphylococcus species NOT DETECTED NOT DETECTED   Staphylococcus aureus (BCID) NOT DETECTED NOT DETECTED   Streptococcus species DETECTED (A) NOT DETECTED   Streptococcus agalactiae NOT DETECTED NOT DETECTED   Streptococcus pneumoniae DETECTED (A) NOT DETECTED   Streptococcus pyogenes NOT DETECTED NOT DETECTED   Acinetobacter baumannii NOT DETECTED NOT DETECTED   Enterobacteriaceae species NOT DETECTED NOT DETECTED   Enterobacter cloacae complex NOT DETECTED NOT DETECTED   Escherichia coli NOT DETECTED NOT DETECTED   Klebsiella oxytoca NOT DETECTED NOT DETECTED   Klebsiella pneumoniae NOT DETECTED NOT DETECTED   Proteus species NOT DETECTED NOT DETECTED   Serratia marcescens NOT DETECTED NOT DETECTED   Haemophilus influenzae NOT DETECTED NOT DETECTED   Neisseria meningitidis NOT DETECTED NOT DETECTED   Pseudomonas aeruginosa NOT DETECTED NOT DETECTED   Candida albicans NOT DETECTED NOT DETECTED   Candida glabrata NOT DETECTED NOT DETECTED   Candida krusei NOT DETECTED NOT DETECTED   Candida parapsilosis NOT DETECTED NOT DETECTED   Candida tropicalis NOT DETECTED NOT  DETECTED    Herby Abraham, Pharm.D [phone] 01/01/2020 9:21 PM

## 2020-01-01 NOTE — ED Notes (Signed)
Attempted to call report to 5E, this RN was not available at the time scheduled to call report D/T emergent situation.

## 2020-01-01 NOTE — Progress Notes (Signed)
Patient is a yellow MEWS because her heart rate is between 110-120 and respirations are between 20-31, this is not an acute change. MEWS protocol is already in place.

## 2020-01-01 NOTE — ED Notes (Signed)
Report given to 5E RN, Britta Mccreedy.

## 2020-01-01 NOTE — ED Notes (Signed)
Pt o2 saturation decreased to 87% on RA pt placed on 2L [place] and increased to 95%.

## 2020-01-01 NOTE — ED Notes (Signed)
Pt ambulated to restroom without assistance.

## 2020-01-01 NOTE — ED Provider Notes (Signed)
[facility] DEPT Provider Note   CSN: [id number] Arrival date & time: 12/31/19  2222     History Chief Complaint  Patient presents with  . Abdominal Pain  . Fever    Cassandra Benitez is a 39 y.o. female.  The history is provided by the patient.  Abdominal Pain Pain location:  RUQ Pain quality: aching   Pain radiates to:  R shoulder Pain severity:  Moderate Timing:  Intermittent Progression:  Worsening Chronicity:  New Relieved by:  Nothing Worsened by:  Palpation, movement and coughing Associated symptoms: chills, cough and fever   Associated symptoms: no vomiting   Fever Associated symptoms: chills and cough   Associated symptoms: no vomiting   Patient with previous history of kidney stones presents with upper abdominal pain.  She reports throughout the day she has been having fevers, chills, right upper quadrant abdominal pain.  The pain at times will go into her chest and her shoulder.  She reports recent cough and cold symptoms since last week that are also worsening.  No vomiting or diarrhea.  She has not been tested for COVID-19.  She did receive her second vaccine over 48 hours ago. She has never had this pain before.  She has a distant history of kidney stones requiring nephrostomy tube Patient reports frequent alcohol use but not a daily drinker    Past Medical History:  Diagnosis Date  . Kidney stones     There are no problems to display for this patient.   Past Surgical History:  Procedure Laterality Date  . kidney stones    . SHOULDER ARTHROSCOPY WITH ROTATOR CUFF REPAIR  10/2019     OB History    Gravida  3   Para  1   Term  1   Preterm      AB  1   Living  1     SAB  1   TAB      Ectopic      Multiple      Live Births              History reviewed. No pertinent family history.  Social History   Tobacco Use  . Smoking status: Never Smoker  . Smokeless tobacco: Never Used  Substance Use  Topics  . Alcohol use: Yes  . Drug use: Never    Home Medications Prior to Admission medications   Medication Sig Start Date End Date Taking? Authorizing Provider  amphetamine-dextroamphetamine (ADDERALL XR) 25 MG 24 hr capsule Adderall XR 25 mg capsule,extended release  TAKE 1 CAPSULE BY MOUTH EVERY MORNING    [provider]  cyclobenzaprine (FLEXERIL) 10 MG tablet cyclobenzaprine 10 mg tablet  TAKE 1 TABLET(S) EVERY 6 8 HOURS AS NEEDED FOR SPASM    [provider]  levonorgestrel (MIRENA, 52 MG,) 20 MCG/24HR IUD by Intrauterine route once for 1 dose. 10/23/19 08/26/43  Copland, Deirdre Evener, PA-C  naproxen (NAPROSYN) 500 MG tablet naproxen 500 mg tablet  TAKE 1 TABLET BY MOUTH TWICE A DAY    [provider]  ondansetron (ZOFRAN) 4 MG tablet ondansetron HCl 4 mg tablet  TAKE 1 TABLET EVERY 6 8 HOURS AS NEEDED FOR POST OP NAUSEA    [provider]  oxyCODONE-acetaminophen (PERCOCET/ROXICET) 5-325 MG tablet oxycodone-acetaminophen 5 mg-325 mg tablet  TAKE 1 TABLET BY MOUTH EVERY 4 TO 6 HOURS AS NEEDED    [provider]    Allergies    Patient has no  known allergies.  Review of Systems   Review of Systems  Constitutional: Positive for chills and fever.  Respiratory: Positive for cough.   Gastrointestinal: Positive for abdominal pain. Negative for vomiting.  All other systems reviewed and are negative.   Physical Exam Updated Vital Signs BP 107/77   Pulse (!) 116   Temp 98.7 F (37.1 C) (Oral) Comment: 500 mg Tylenol @ 18:00   Resp (!) 26   Ht 1.575 m (5\' 2" )   Wt 56.7 kg   SpO2 97%   BMI 22.86 kg/m   Physical Exam CONSTITUTIONAL: Well developed/well nourished HEAD: Normocephalic/atraumatic EYES: EOMI/PERRL ENMT: Mucous membranes moist NECK: supple no meningeal signs SPINE/BACK:entire spine nontender CV: S1/S2 noted, no murmurs/rubs/gallops noted LUNGS: Lungs are clear to auscultation bilaterally, no apparent distress, coughs  frequently on exam ABDOMEN: soft, + RUQ tenderness, no rebound or guarding, bowel sounds noted throughout abdomen GU:no cva tenderness NEURO: Pt is awake/alert/appropriate, moves all extremitiesx4.  No facial droop.  EXTREMITIES: pulses normal/equal, full ROM SKIN: warm, color normal PSYCH: no abnormalities of mood noted, alert and oriented to situation  ED Results / Procedures / Treatments   Labs (all labs ordered are listed, but only abnormal results are displayed) Labs Reviewed  COMPREHENSIVE METABOLIC PANEL - Abnormal; Notable for the following components:      Result Value   Sodium 134 (*)    Glucose, Bld 109 (*)    All other components within normal limits  CBC - Abnormal; Notable for the following components:   WBC 28.2 (*)    All other components within normal limits  URINALYSIS, ROUTINE W REFLEX MICROSCOPIC - Abnormal; Notable for the following components:   Color, Urine AMBER (*)    APPearance CLOUDY (*)    Hgb urine dipstick SMALL (*)    Protein, ur 30 (*)    Leukocytes,Ua SMALL (*)    Bacteria, UA RARE (*)    All other components within normal limits  RESPIRATORY PANEL BY RT PCR (FLU A&B, COVID)  CULTURE, BLOOD (ROUTINE X 2)  CULTURE, BLOOD (ROUTINE X 2)  URINE CULTURE  LIPASE, BLOOD  HCG, QUANTITATIVE, PREGNANCY  LACTIC ACID, PLASMA    EKG EKG Interpretation  Date/Time:  Monday Jan 01 2020 01:21:51 EDT Ventricular Rate:  116 PR Interval:    QRS Duration: 89 QT Interval:  315 QTC Calculation: 438 R Axis:   59 Text Interpretation: Sinus tachycardia Probable left atrial enlargement No previous ECGs available Confirmed by [id number] (Zadie Rhine) on 01/01/2020 1:30:21 AM   Radiology CT ABDOMEN PELVIS W CONTRAST  Result Date: 01/01/2020 CLINICAL DATA:  Right upper quadrant pain. EXAM: CT ABDOMEN AND PELVIS WITH CONTRAST TECHNIQUE: Multidetector CT imaging of the abdomen and pelvis was performed using the standard protocol following bolus administration of  intravenous contrast. CONTRAST:  03/02/2020 OMNIPAQUE IOHEXOL 300 MG/ML  SOLN COMPARISON:  Right upper quadrant ultrasound from same day. FINDINGS: Lower chest: Right middle lobe consolidation. Patchy central consolidations in both lower lobes. Hepatobiliary: No focal liver abnormality is seen. Trace pericholecystic fluid. No gallstones, gallbladder wall thickening, or biliary dilatation. Pancreas: Unremarkable. No pancreatic ductal dilatation or surrounding inflammatory changes. Spleen: Normal in size without focal abnormality. Adrenals/Urinary Tract: Adrenal glands are unremarkable. Kidneys are normal, without renal calculi, focal lesion, or hydronephrosis. Bladder is decompressed. Stomach/Bowel: Stomach is within normal limits. Appendix appears normal. No evidence of bowel wall thickening, distention, or inflammatory changes. Vascular/Lymphatic: No significant vascular findings are present. No enlarged abdominal or pelvic lymph nodes. Reproductive: Uterus  and bilateral adnexa are unremarkable. IUD within the endometrial canal. Other: Trace free fluid in the pelvis, likely physiologic. No pneumoperitoneum. Musculoskeletal: No acute or significant osseous findings. IMPRESSION: 1. Multifocal pneumonia. 2. Trace pericholecystic fluid, likely incidental given otherwise normal right upper quadrant ultrasound from earlier this morning. Electronically Signed   By: Obie Dredge M.D.   On: 01/01/2020 05:49   DG Chest Port 1 View  Result Date: 01/01/2020 CLINICAL DATA:  39 year old female with cough. EXAM: PORTABLE CHEST 1 VIEW COMPARISON:  None FINDINGS: Focal right middle lobe density with silhouetting of the right cardiac border. The left lung is clear. No pleural effusion or pneumothorax. The cardiac silhouette is within limits. No acute osseous pathology. IMPRESSION: Right middle lobe pneumonia. Electronically Signed   By: Elgie Collard M.D.   On: 01/01/2020 01:37   US Abdomen Limited RUQ  Result Date:  01/01/2020 CLINICAL DATA:  39 year old female with right upper quadrant abdominal pain. EXAM: ULTRASOUND ABDOMEN LIMITED RIGHT UPPER QUADRANT COMPARISON:  None. FINDINGS: Gallbladder: There is no gallstone, gallbladder wall thickening, or pericholecystic fluid. Negative sonographic Murphy's sign. There is a 4 mm gallbladder polyp. Common bile duct: Diameter: 2 mm Liver: No focal lesion identified. Within normal limits in parenchymal echogenicity. Portal vein is patent on color Doppler imaging with normal direction of blood flow towards the liver. Other: None. IMPRESSION: A 4 mm gallbladder polyp, otherwise unremarkable right upper quadrant ultrasound. Follow-up in 6-12 months recommended. Electronically Signed   By: Elgie Collard M.D.   On: 01/01/2020 02:18    Procedures .Critical Care Performed by: Zadie Rhine, MD Authorized by: Zadie Rhine, MD   Critical care provider statement:    Critical care time (minutes):  60   Critical care start time:  01/01/2020 4:30 AM   Critical care end time:  01/01/2020 5:30 AM   Critical care time was exclusive of:  Separately billable procedures and treating other patients   Critical care was necessary to treat or prevent imminent or life-threatening deterioration of the following conditions:  Sepsis, dehydration and respiratory failure   Critical care was time spent personally by me on the following activities:  Ordering and performing treatments and interventions, ordering and review of laboratory studies, ordering and review of radiographic studies, pulse oximetry, re-evaluation of patient's condition, evaluation of patient's response to treatment, examination of patient, development of treatment plan with patient or surrogate and discussions with consultants   I assumed direction of critical care for this patient from another provider in my specialty: no       Medications Ordered in ED Medications  sodium chloride (PF) 0.9 % injection (has no  administration in time range)  ketorolac (TORADOL) 30 MG/ML injection 30 mg (has no administration in time range)  lactated ringers bolus 1,000 mL (0 mLs Intravenous Stopped 01/01/20 0231)    And  lactated ringers bolus 500 mL (0 mLs Intravenous Stopped 01/01/20 0315)    And  lactated ringers bolus 250 mL (0 mLs Intravenous Stopped 01/01/20 0320)  cefTRIAXone (ROCEPHIN) 2 g in sodium chloride 0.9 % 100 mL IVPB (0 g Intravenous Stopped 01/01/20 0231)  metroNIDAZOLE (FLAGYL) IVPB 500 mg (0 mg Intravenous Stopped 01/01/20 0330)  fentaNYL (SUBLIMAZE) injection 100 mcg (100 mcg Intravenous Given 01/01/20 0136)  azithromycin (ZITHROMAX) tablet 500 mg (500 mg Oral Given 01/01/20 0255)  guaiFENesin (ROBITUSSIN) 100 MG/5ML solution 100 mg (100 mg Oral Given 01/01/20 0323)  sodium chloride 0.9 % bolus 1,000 mL (1,000 mLs Intravenous New Bag/Given 01/01/20 0520)  fentaNYL (SUBLIMAZE) injection 100 mcg (100 mcg Intravenous Given 01/01/20 0508)  iohexol (OMNIPAQUE) 300 MG/ML solution 100 mL (100 mLs Intravenous Contrast Given 01/01/20 0455)    ED Course  I have reviewed the triage vital signs and the nursing notes.  Pertinent labs & imaging results that were available during my care of the patient were reviewed by me and considered in my medical decision making (see chart for details).    MDM Rules/Calculators/A&P                       This patient presents to the ED for concern of fever, cough, abdominal pain, this involves an extensive number of treatment options, and is a complaint that carries with it a high risk of complications and morbidity.  The differential diagnosis includes pneumonia, cholecystitis, kidney stone, COVID-19  Patient had focal right upper quadrant tenderness as well as cough and pleuritic pain in her right chest from coughing.  It was difficult to discern on exam if this was pneumonia versus cholecystitis.  Chest x-ray is consistent with pneumonia.  Work-up is pending at this time 4:31  AM Patient worsening.  Patient tachycardic to 130s now reporting diffuse abdominal pain. Initially it was felt that her upper abdomen and chest wall pain were from pneumonia, but now reports diffuse abdominal pain and flank pain.  We will need to obtain CT abdomen pelvis and admit patient.  IV fluids also ordered  6:51 AM CT scan shows no acute abdominal findings, patient has multifocal pneumonia Patient now has an oxygen requirement and pulse ox was dropped to low 90s.  Patient still tachycardic. Patient will be admitted to the hospital.  Discussed with Dr. Loney Loh for admission  Lab Tests:   I Ordered, reviewed, and interpreted labs, which included LFTs, electrolytes, CBC, lactate, urinalysis  Medicines ordered:   I ordered medication fluids, antibiotics, pain medication for possible sepsis  Imaging Studies ordered:   I ordered imaging studies which included chest x-ray and right upper quadrant ultrasound, CT abd/pelvis  I independently visualized and interpreted imaging which showed gallbladder polyp and pneumonia    Consultations Obtained:   I consulted [facility] hospital[facility] and discussed lab and imaging findings  Reevaluation:  After the interventions stated above, I reevaluated the patient and found patient is stable  Critical Interventions:  . IV fluids and IV antibiotics   Daanya Lanphier was evaluated in Emergency Department on 01/01/2020 for the symptoms described in the history of present illness. She was evaluated in the context of the global COVID-19 pandemic, which necessitated consideration that the patient might be at risk for infection with the SARS-CoV-2 virus that causes COVID-19. Institutional protocols and algorithms that pertain to the evaluation of patients at risk for COVID-19 are in a state of rapid change based on information released by regulatory bodies including the CDC and federal and state organizations. These policies and algorithms were followed  during the patient's care in the ED.  Final Clinical Impression(s) / ED Diagnoses Final diagnoses:  RUQ pain  Community acquired pneumonia of right middle lobe of lung  Gallbladder polyp    Rx / DC Orders ED Discharge Orders    None       Zadie Rhine, MD 01/01/20 [phone]

## 2020-01-01 NOTE — Discharge Instructions (Signed)
You will need a follow-up ultrasound of your gallbladder in 1 year to evaluate for the polyp

## 2020-01-01 NOTE — Progress Notes (Addendum)
Patient came up from ED in the yellow mews. Charge nurse Christena Deem was in the room and completed patient initial assessment. Per Dahlia Client notified Dr. Waymon Amato by [facility] page. Dr. Waymon Amato called back w/ no new orders. Will gave PRN toradol per Christena Deem, RN. Patient is laying in bed and is A&Ox4.   VS 1109 T 100.0 BP 108/67 HR 107 RR 21 O2 94% on 3 L / [place]

## 2020-01-01 NOTE — Progress Notes (Signed)
Addendum  Was paged earlier this afternoon that patient had Red MEWS.  I immediately called the nurse.  She indicated that apart from fever and associated tachypnea and tachycardia, patient was otherwise not in distress and stable.  She had alerted rapid response. She had just given her Tylenol.  I advised her to closely monitor her and call back if does not improve or worsens.  I was in the process of urgently transferring the patient from ED to [facility].   I followed up with RN just now, patient has improved and currently in green MEWS.  Continue close monitoring and management.  Marcellus Scott, MD, [place], [facility]. [facility]  To contact the attending provider between 7A-7P or the covering provider during after hours 7P-7A, please log into the web site [email or web address] and access using universal [place] password for that web site. If you do not have the password, please call the hospital operator.'

## 2020-01-01 NOTE — Progress Notes (Signed)
Patient is now in the RED MEWS. Notified charge nurse Christena Deem, Rapid Response Nurse Zoe, Dr. Waymon Amato. Gave PRN tylenol and will recheck temp. No new orders from Dr. Waymon Amato. He did recommend ice packs for patient.   Temp 101.6 BP 107/70 RR 31 HR 107 95 % on 3L nasal cannula.   [facility], RN came and assessed the patient.

## 2020-01-02 DIAGNOSIS — R7881 Bacteremia: Secondary | ICD-10-CM

## 2020-01-02 DIAGNOSIS — B955 Unspecified streptococcus as the cause of diseases classified elsewhere: Secondary | ICD-10-CM

## 2020-01-02 LAB — COMPREHENSIVE METABOLIC PANEL
ALT: 13 U/L (ref 0–44)
AST: 12 U/L — ABNORMAL LOW (ref 15–41)
Albumin: 2.6 g/dL — ABNORMAL LOW (ref 3.5–5.0)
Alkaline Phosphatase: 62 U/L (ref 38–126)
Anion gap: 7 (ref 5–15)
BUN: 8 mg/dL (ref 6–20)
CO2: 21 mmol/L — ABNORMAL LOW (ref 22–32)
Calcium: 7.8 mg/dL — ABNORMAL LOW (ref 8.9–10.3)
Chloride: 107 mmol/L (ref 98–111)
Creatinine, Ser: 0.8 mg/dL (ref 0.44–1.00)
GFR calc Af Amer: >60 mL/min (ref >60)
GFR calc non Af Amer: >60 mL/min (ref >60)
Glucose, Bld: 108 mg/dL — ABNORMAL HIGH (ref 70–99)
Potassium: 3.3 mmol/L — ABNORMAL LOW (ref 3.5–5.1)
Sodium: 135 mmol/L (ref 135–145)
Total Bilirubin: 0.5 mg/dL (ref 0.3–1.2)
Total Protein: 5.2 g/dL — ABNORMAL LOW (ref 6.5–8.1)

## 2020-01-02 LAB — CBC
HCT: 32 % — ABNORMAL LOW (ref 36.0–46.0)
Hemoglobin: 10.6 g/dL — ABNORMAL LOW (ref 12.0–15.0)
MCH: 31.1 pg (ref 26.0–34.0)
MCHC: 33.1 g/dL (ref 30.0–36.0)
MCV: 93.8 fL (ref 80.0–100.0)
Platelets: 158 10*3/uL (ref 150–400)
RBC: 3.41 MIL/uL — ABNORMAL LOW (ref 3.87–5.11)
RDW: 13.3 % (ref 11.5–15.5)
WBC: 23.5 10*3/uL — ABNORMAL HIGH (ref 4.0–10.5)
nRBC: 0 % (ref 0.0–0.2)

## 2020-01-02 LAB — HIV ANTIBODY (ROUTINE TESTING W REFLEX): HIV Screen 4th Generation wRfx: NONREACTIVE

## 2020-01-02 LAB — PROCALCITONIN: Procalcitonin: 0.44 ng/mL

## 2020-01-02 MED ORDER — IPRATROPIUM BROMIDE 0.02 % IN SOLN
0.5000 mg | Freq: Two times a day (BID) | RESPIRATORY_TRACT | Status: DC
  Administered 2020-01-03 – 2020-01-04 (×3): 0.5 mg via RESPIRATORY_TRACT
  Filled 2020-01-02 (×3): qty 2.5

## 2020-01-02 MED ORDER — LEVALBUTEROL HCL 0.63 MG/3ML IN NEBU
0.6300 mg | INHALATION_SOLUTION | Freq: Two times a day (BID) | RESPIRATORY_TRACT | Status: DC
  Administered 2020-01-03 – 2020-01-04 (×3): 0.63 mg via RESPIRATORY_TRACT
  Filled 2020-01-02 (×3): qty 3

## 2020-01-02 MED ORDER — SODIUM CHLORIDE 0.9 % IV SOLN: INTRAVENOUS | Status: AC

## 2020-01-02 MED ORDER — LEVALBUTEROL HCL 0.63 MG/3ML IN NEBU
0.6300 mg | INHALATION_SOLUTION | Freq: Four times a day (QID) | RESPIRATORY_TRACT | Status: DC
  Administered 2020-01-02: 0.63 mg via RESPIRATORY_TRACT
  Filled 2020-01-02: qty 3

## 2020-01-02 MED ORDER — POTASSIUM CHLORIDE CRYS ER 20 MEQ PO TBCR
40.0000 meq | EXTENDED_RELEASE_TABLET | Freq: Two times a day (BID) | ORAL | Status: AC
  Administered 2020-01-02 (×2): 40 meq via ORAL
  Filled 2020-01-02 (×2): qty 2

## 2020-01-02 MED ORDER — SODIUM CHLORIDE 0.9 % IV SOLN: 500.0000 mg | INTRAVENOUS | Status: DC

## 2020-01-02 MED ORDER — IPRATROPIUM BROMIDE 0.02 % IN SOLN
0.5000 mg | Freq: Four times a day (QID) | RESPIRATORY_TRACT | Status: DC
  Administered 2020-01-02: 0.5 mg via RESPIRATORY_TRACT
  Filled 2020-01-02: qty 2.5

## 2020-01-02 NOTE — Progress Notes (Signed)
PROGRESS NOTE    Cassandra Benitez  AQT:[id number] DOB: 1981/04/08 DOA: 01/01/2020 PCP: Cassandra Bosch, NP  Brief Narrative:  HPI per Dr. Carole Benitez is a 39 year old female, lives with family, does not currently work, independent, PMH of kidney stones that have required intervention in the past, ADHD but no other PMH, presented to the [facility] ED on 12/31/2019 due to above complaints.  Week prior to admission, she was ill with 3 days of URI symptoms that she caught from her 10-year-old child, recovered.  She received second dose of her Pfizer COVID-19 vaccine on 12/29/2019.  Since morning of 12/31/2019, she started experiencing right lower anterior chest pain which started off as mild to moderate then progressively got worse, radiating to right side of back, worse with deep inspiration or coughing, some relief with medications in the ED.  This was associated with productive cough with cream-colored phlegm, no blood.  Fevers as high as 102.6 at home.  Headache and generalized body aches.  She had some cramping right upper abdominal pain which she feels was due to her coughing which has since subsided.  Denies history of recent long distance travel.  Low index of suspicion for VTE at this time but if hypoxia persists despite recovery from pneumonia then may consider this given history of IUD.  ED Course: Temperature of 99.9, tachypneic in the 20s, tachycardia in the 100-1 tens, soft blood pressures in the 90s/50s-70s, mildly hypoxic with oxygen saturation in the low 90s.  Lab work significant for sodium of 134 otherwise CMP unremarkable.  WBC 28.2.  Lactate 1.7.  Beta hCG quantitative <1.  Flu and COVID-19 RT-PCR negative.  Urine microscopy shows rare bacteria, 30 protein, 21-50 white blood cell.  Blood and urine cultures pending.  Chest x-ray confirms right middle lobe pneumonia. RUQ ultrasound: A 4 mm gallbladder polyp, otherwise unremarkable RUQ ultrasound.  Follow-up in 6 to 12  months recommended. CT abdomen and pelvis with contrast: Multifocal pneumonia (right middle lobe consolidation.  Patchy central consolidation in both lower lobes).  Trace pericholecystic fluid, likely incidental given otherwise normal RUQ ultrasound.  EDP evaluated initially for possible abdominal/gallbladder etiology but eventually turned out to be pneumonia. She was treated per sepsis protocol with IV fluids, IV ceftriaxone and oral azithromycin.  States that she feels about the same.  **Interim History  Blood cultures showed that patient has a Streptococcus pneumoniae bacteremia.  She continues to have this morning and breathing treatments have been added.  We will continue IV ceftriaxone.  She is feeling better today and sepsis physiology is improving.  Assessment & Plan:   Principal Problem:   Sepsis due to pneumonia [facility]) Active Problems:   CAP (community acquired pneumonia)   ADHD   Acute on chronic respiratory failure with hypoxia (HCC)  Sepsis, POA, due to Lobar/Community-acquired pneumonia and Streptococcus Pneumoniae Bacteremia  -Treated per sepsis protocol with aggressive IV fluids and Fluids have now stopped  -IV ceftriaxone to continue. D/C Azithromycin -Follow blood and urine culture results. Blood Cx Positive for Strep Pneumonaie and sensitivities are pending  -Repeat blood cultures in the a.m. -Treat supportively with antipyretics, incentive spirometry and flutter valve, oxygen support, antipyretics/NSAIDs.   -Counseled regarding quitting vaping and she verbalized understanding. -New guaifenesin 1200 g p.o. twice daily -Add breathing treatments Xopenex and Atrovent -Sepsis physiology is improving -WBC went from 28.2 is now 23.5 -Procalcitonin level is 0.44 -Lactic acid level is 1.7 -Influenza A/B PCR as well as SARS coronavirus 2 PCR negative -  Continue to monitor respiratory status carefully and repeat chest x-ray in the a.m.  Acute Respiratory Failure with  Hypoxia -Secondary to pneumonia.    Continue oxygen support.   -SpO2: 94 % O2 Flow Rate (L/min): 3 L/min  -Continuous pulse oximetry and maintain O2 saturations greater than 92% -Continue supplemental oxygen via nasal cannula and wean O2 as tolerated -Added breathing treatments as above; Xopenex/Atrovent Scheduled and Albuterol PRN -Need to determine oxygen needs prior to discharge.  ADHD -Continue home dose of Adderall.  Stable.  Vaping -Cessation counseled.  Alcohol use:  -Mild.  If develops any withdrawal symptoms or signs then may consider CIWA protocol.  Leukocytosis -Secondary to sepsis from pneumonia.  Trend daily CBC. -WBC is improving and went from 28.2 and is now 23.5  Metabolic Acidosis -Mild patient CO2 was 21, anion gap is 7" level was 107 -Continued IV fluid hydration as above now stopped -Continue to monitor and repeat CMP in a.m.  Hyperglycemia  -Check hemoglobin A1c in a.m. -Continue monitor blood sugars carefully  Normocytic Anemia -Patient's hemoglobin/hematocrit went from 13.5/40.4 is now 10.6/32.2 -Check anemia panel in the a.m. -Continue to monitor for signs and symptoms of bleeding; currently no overt bleeding noted -Repeat CBC in a.m.  Hypokalemia  -K+ was 3.3 -Replete with po KCl 40 mEQ BID x2  -Continue to Monitor and Replete as Necessary -Repeat CMP  DVT prophylaxis: Enoxaparin 40 mg sq q24h Code Status: FULL CODE  Family Communication: No family present at bedside  Disposition Plan:   Consultants:   None  Procedures: None   Antimicrobials:  Anti-infectives (From admission, onward)   Start     Dose/Rate Route Frequency Ordered Stop   01/02/20 1715  azithromycin (ZITHROMAX) 500 mg in sodium chloride 0.9 % 250 mL IVPB     500 mg 250 mL/hr over 60 Minutes Intravenous Every 24 hours 01/02/20 1709     01/02/20 1000  azithromycin (ZITHROMAX) 500 mg in sodium chloride 0.9 % 250 mL IVPB  Status:  Discontinued     500 mg 250 mL/hr  over 60 Minutes Intravenous Every 24 hours 01/01/20 1033 01/01/20 2134   01/02/20 0600  cefTRIAXone (ROCEPHIN) 2 g in sodium chloride 0.9 % 100 mL IVPB     2 g 200 mL/hr over 30 Minutes Intravenous Every 24 hours 01/01/20 1033     01/01/20 0245  azithromycin (ZITHROMAX) tablet 500 mg     500 mg Oral  Once 01/01/20 0239 01/01/20 0255   01/01/20 0115  cefTRIAXone (ROCEPHIN) 2 g in sodium chloride 0.9 % 100 mL IVPB     2 g 200 mL/hr over 30 Minutes Intravenous  Once 01/01/20 0108 01/01/20 0231   01/01/20 0115  metroNIDAZOLE (FLAGYL) IVPB 500 mg     500 mg 100 mL/hr over 60 Minutes Intravenous  Once 01/01/20 0108 01/01/20 0330     Subjective: Seen and examined at bedside and she is feeling little bit better.  No nausea or vomiting.  States that she is coughing up some greenish-brownish sputum today.  Denies any lightheadedness or dizziness.  No other concerns or plans at this time.  Objective: Vitals:   01/01/20 2146 01/02/20 0256 01/02/20 0616 01/02/20 1316  BP: 115/73 121/73 113/73 122/86  Pulse: (!) 116 (!) 110 97 92  Resp: (!) 21 19 18 18   Temp: 99.6 F (37.6 C) 100.2 F (37.9 C) 98.4 F (36.9 C) 98.6 F (37 C)  TempSrc:    Oral  SpO2: 95% 92% 90% 94%  Weight:      Height:        Intake/Output Summary (Last 24 hours) at 01/02/2020 1709 Last data filed at 01/02/2020 1600 Gross per 24 hour  Intake 2149.52 ml  Output 0 ml  Net 2149.52 ml   Filed Weights   12/31/19 2239  Weight: 56.7 kg   Examination: Physical Exam:  Constitutional: WN/WD Caucasian female currently in NAD and appears calm and comfortable Eyes: Lids and conjunctivae normal, sclerae anicteric  ENMT: External Ears, Nose appear normal. Grossly normal hearing.  Neck: Appears normal, supple, no cervical masses, normal ROM, no appreciable thyromegaly; no JVD Respiratory: Diminished to auscultation bilaterally with coarse breath sounds and some rhonchi, no wheezing, rales, rhonchi or crackles. Normal respiratory  effort and patient is not tachypenic. No accessory muscle use.  Wearing supplemental oxygen via nasal cannula Cardiovascular: RRR, no murmurs / rubs / gallops. S1 and S2 auscultated.  Abdomen: Soft, non-tender, non-distended. Bowel sounds positive.  GU: Deferred. Musculoskeletal: No clubbing / cyanosis of digits/nails. No joint deformity upper and lower extremities.  Skin: No rashes, lesions, ulcers on limited skin evaluation. No induration; Warm and dry.  Neurologic: CN 2-12 grossly intact with no focal deficits. Romberg sign cerebellar reflexes not assessed.  Psychiatric: Normal judgment and insight. Alert and oriented x 3. Normal mood and appropriate affect.   Data Reviewed: I have personally reviewed following labs and imaging studies  CBC: Recent Labs  Lab 12/31/19 2246 01/02/20 0558  WBC 28.2* 23.5*  HGB 13.5 10.6*  HCT 40.4 32.0*  MCV 93.5 93.8  PLT 194 158   Basic Metabolic Panel: Recent Labs  Lab 12/31/19 2246 01/02/20 0558  NA 134* 135  K 3.8 3.3*  CL 101 107  CO2 23 21*  GLUCOSE 109* 108*  BUN 13 8  CREATININE 0.87 0.80  CALCIUM 9.2 7.8*   GFR: Estimated Creatinine Clearance: 74.7 mL/min (by C-G formula based on SCr of 0.8 mg/dL). Liver Function Tests: Recent Labs  Lab 12/31/19 2246 01/02/20 0558  AST 17 12*  ALT 18 13  ALKPHOS 66 62  BILITOT 0.8 0.5  PROT 7.1 5.2*  ALBUMIN 4.1 2.6*   Recent Labs  Lab 12/31/19 2246  LIPASE 19   No results for input(s): AMMONIA in the last 168 hours. Coagulation Profile: No results for input(s): INR, PROTIME in the last 168 hours. Cardiac Enzymes: No results for input(s): CKTOTAL, CKMB, CKMBINDEX, TROPONINI in the last 168 hours. BNP (last 3 results) No results for input(s): PROBNP in the last 8760 hours. HbA1C: No results for input(s): HGBA1C in the last 72 hours. CBG: No results for input(s): GLUCAP in the last 168 hours. Lipid Profile: No results for input(s): CHOL, HDL, LDLCALC, TRIG, CHOLHDL, LDLDIRECT  in the last 72 hours. Thyroid Function Tests: No results for input(s): TSH, T4TOTAL, FREET4, T3FREE, THYROIDAB in the last 72 hours. Anemia Panel: No results for input(s): VITAMINB12, FOLATE, FERRITIN, TIBC, IRON, RETICCTPCT in the last 72 hours. Sepsis Labs: Recent Labs  Lab 01/01/20 0200 01/02/20 0558  PROCALCITON  --  0.44  LATICACIDVEN 1.7  --     Recent Results (from the past 240 hour(s))  Respiratory Panel by RT PCR (Flu A&B, Covid) - Nasopharyngeal Swab     Status: None   Collection Time: 01/01/20  1:07 AM   Specimen: Nasopharyngeal Swab  Result Value Ref Range Status   SARS Coronavirus 2 by RT PCR NEGATIVE NEGATIVE Final    Comment: (NOTE) SARS-CoV-2 target nucleic acids are NOT DETECTED. The  SARS-CoV-2 RNA is generally detectable in upper respiratoy specimens during the acute phase of infection. The lowest concentration of SARS-CoV-2 viral copies this assay can detect is 131 copies/mL. A negative result does not preclude SARS-Cov-2 infection and should not be used as the sole basis for treatment or other patient management decisions. A negative result may occur with  improper specimen collection/handling, submission of specimen other than nasopharyngeal swab, presence of viral mutation(s) within the areas targeted by this assay, and inadequate number of viral copies (<131 copies/mL). A negative result must be combined with clinical observations, patient history, and epidemiological information. The expected result is Negative. Fact Sheet for Patients:  https://www.moore.com/https://www.fda.gov/media/142436/download Fact Sheet for Healthcare Providers:  https://www.young.biz/https://www.fda.gov/media/142435/download This test is not yet ap proved or cleared by the [place] FDA and  has been authorized for detection and/or diagnosis of SARS-CoV-2 by FDA under an Emergency Use Authorization (EUA). This EUA will remain  in effect (meaning this test can be used) for the duration of the COVID-19 declaration  under Section 564(b)(1) of the Act, 21 U.S.C. section 360bbb-3(b)(1), unless the authorization is terminated or revoked sooner.    Influenza A by PCR NEGATIVE NEGATIVE Final   Influenza B by PCR NEGATIVE NEGATIVE Final    Comment: (NOTE) The Xpert Xpress SARS-CoV-2/FLU/RSV assay is intended as an aid in  the diagnosis of influenza from Nasopharyngeal swab specimens and  should not be used as a sole basis for treatment. Nasal washings and  aspirates are unacceptable for Xpert Xpress SARS-CoV-2/FLU/RSV  testing. Fact Sheet for Patients: https://www.moore.com/https://www.fda.gov/media/142436/download Fact Sheet for Healthcare Providers: https://www.young.biz/https://www.fda.gov/media/142435/download This test is not yet approved or cleared by the [place] FDA and  has been authorized for detection and/or diagnosis of SARS-CoV-2 by  FDA under an Emergency Use Authorization (EUA). This EUA will remain  in effect (meaning this test can be used) for the duration of the  Covid-19 declaration under Section 564(b)(1) of the Act, 21  U.S.C. section 360bbb-3(b)(1), unless the authorization is  terminated or revoked. Performed at [facility], [address] [place], [place] [address]   Urine culture     Status: None   Collection Time: 01/01/20  1:08 AM   Specimen: In/Out Cath Urine  Result Value Ref Range Status   Specimen Description   Final    IN/OUT CATH URINE Performed at [facility], [address] [place], [place] [address]    Special Requests   Final    NONE Performed at [facility], [address] [place], [place] [address]    Culture   Final    NO GROWTH Performed at [facility], [address] [place], [place] [address]    Report Status 01/01/2020 FINAL  Final  Blood Culture (routine x 2)     Status: Abnormal (Preliminary result)   Collection Time: 01/01/20  1:51 AM   Specimen: BLOOD  Result Value Ref Range Status   Specimen Description    Final    BLOOD RIGHT WRIST Performed at [facility], [address] [place], [place] [address]    Special Requests   Final    BOTTLES DRAWN AEROBIC AND ANAEROBIC Blood Culture adequate volume Performed at [facility], [address] [place], [place] [address]    Culture  Setup Time   Final    ANAEROBIC BOTTLE ONLY GRAM POSITIVE COCCI CRITICAL VALUE NOTED.  VALUE IS CONSISTENT WITH PREVIOUSLY REPORTED AND CALLED VALUE. Performed at  [facility], [place] [address] [place], [place] [address]    Culture STREPTOCOCCUS PNEUMONIAE (A)  Final   Report Status PENDING  Incomplete  Blood Culture (routine x 2)     Status: Abnormal (Preliminary result)   Collection Time: 01/01/20  1:51 AM   Specimen: BLOOD  Result Value Ref Range Status   Specimen Description   Final    BLOOD LEFT ANTECUBITAL Performed at [place] [place] [address], [place], [place] [address]    Special Requests   Final    BOTTLES DRAWN AEROBIC AND ANAEROBIC Blood Culture results may not be optimal due to an excessive volume of blood received in culture bottles Performed at [place] [address], [place], [place] [address]    Culture  Setup Time   Final    IN BOTH AEROBIC AND ANAEROBIC BOTTLES GRAM POSITIVE COCCI CRITICAL RESULT CALLED TO, READ BACK BY AND VERIFIED WITH: Colin Rhein [facility] 01/01/20 2111 JDW    Culture (A)  Final    STREPTOCOCCUS PNEUMONIAE SUSCEPTIBILITIES TO FOLLOW Performed at [facility], [place] [address], [place], [place] [address]    Report Status PENDING  Incomplete  Blood Culture ID Panel (Reflexed)     Status: Abnormal   Collection Time: 01/01/20  1:51 AM  Result Value Ref Range Status   Enterococcus species NOT DETECTED NOT DETECTED Final   Listeria monocytogenes NOT DETECTED NOT DETECTED Final   Staphylococcus species NOT DETECTED NOT DETECTED Final   Staphylococcus aureus (BCID) NOT DETECTED NOT  DETECTED Final   Streptococcus species DETECTED (A) NOT DETECTED Final    Comment: CRITICAL RESULT CALLED TO, READ BACK BY AND VERIFIED WITH: M BELL PHARMD 5/10 21 2111 JDW    Streptococcus agalactiae NOT DETECTED NOT DETECTED Final   Streptococcus pneumoniae DETECTED (A) NOT DETECTED Final    Comment: CRITICAL RESULT CALLED TO, READ BACK BY AND VERIFIED WITH: Colin Rhein PHARMD 01/01/20 2111 JDW    Streptococcus pyogenes NOT DETECTED NOT DETECTED Final   Acinetobacter baumannii NOT DETECTED NOT DETECTED Final   Enterobacteriaceae species NOT DETECTED NOT DETECTED Final   Enterobacter cloacae complex NOT DETECTED NOT DETECTED Final   Escherichia coli NOT DETECTED NOT DETECTED Final   Klebsiella oxytoca NOT DETECTED NOT DETECTED Final   Klebsiella pneumoniae NOT DETECTED NOT DETECTED Final   Proteus species NOT DETECTED NOT DETECTED Final   Serratia marcescens NOT DETECTED NOT DETECTED Final   Haemophilus influenzae NOT DETECTED NOT DETECTED Final   Neisseria meningitidis NOT DETECTED NOT DETECTED Final   Pseudomonas aeruginosa NOT DETECTED NOT DETECTED Final   Candida albicans NOT DETECTED NOT DETECTED Final   Candida glabrata NOT DETECTED NOT DETECTED Final   Candida krusei NOT DETECTED NOT DETECTED Final   Candida parapsilosis NOT DETECTED NOT DETECTED Final   Candida tropicalis NOT DETECTED NOT DETECTED Final    Comment: Performed at [facility], [place]. [address], [place], [place] [address]    RN Pressure Injury Documentation:     Estimated body mass index is 22.86 kg/m as calculated from the following:   Height as of this encounter: 5\' 2"  (1.575 m).   Weight as of this encounter: 56.7 kg.  Malnutrition Type:      Malnutrition Characteristics:      Nutrition Interventions:    Radiology Studies: CT ABDOMEN PELVIS W CONTRAST  Result Date: 01/01/2020 CLINICAL DATA:  Right upper quadrant pain. EXAM: CT ABDOMEN AND PELVIS WITH CONTRAST TECHNIQUE:  Multidetector CT  imaging of the abdomen and pelvis was performed using the standard protocol following bolus administration of intravenous contrast. CONTRAST:  OMNIPAQUE IOHEXOL 300 MG/ML  SOLN COMPARISON:  Right upper quadrant ultrasound from same day. FINDINGS: Lower chest: Right middle lobe consolidation. Patchy central consolidations in both lower lobes. Hepatobiliary: No focal liver abnormality is seen. Trace pericholecystic fluid. No gallstones, gallbladder wall thickening, or biliary dilatation. Pancreas: Unremarkable. No pancreatic ductal dilatation or surrounding inflammatory changes. Spleen: Normal in size without focal abnormality. Adrenals/Urinary Tract: Adrenal glands are unremarkable. Kidneys are normal, without renal calculi, focal lesion, or hydronephrosis. Bladder is decompressed. Stomach/Bowel: Stomach is within normal limits. Appendix appears normal. No evidence of bowel wall thickening, distention, or inflammatory changes. Vascular/Lymphatic: No significant vascular findings are present. No enlarged abdominal or pelvic lymph nodes. Reproductive: Uterus and bilateral adnexa are unremarkable. IUD within the endometrial canal. Other: Trace free fluid in the pelvis, likely physiologic. No pneumoperitoneum. Musculoskeletal: No acute or significant osseous findings. IMPRESSION: 1. Multifocal pneumonia. 2. Trace pericholecystic fluid, likely incidental given otherwise normal right upper quadrant ultrasound from earlier this morning. Electronically Signed   By: Obie Dredge M.D.   On: 01/01/2020 05:49   DG Chest Port 1 View  Result Date: 01/01/2020 CLINICAL DATA:  39 year old female with cough. EXAM: PORTABLE CHEST 1 VIEW COMPARISON:  None FINDINGS: Focal right middle lobe density with silhouetting of the right cardiac border. The left lung is clear. No pleural effusion or pneumothorax. The cardiac silhouette is within limits. No acute osseous pathology. IMPRESSION: Right middle lobe pneumonia.  Electronically Signed   By: Elgie Collard M.D.   On: 01/01/2020 01:37   US Abdomen Limited RUQ  Result Date: 01/01/2020 CLINICAL DATA:  39 year old female with right upper quadrant abdominal pain. EXAM: ULTRASOUND ABDOMEN LIMITED RIGHT UPPER QUADRANT COMPARISON:  None. FINDINGS: Gallbladder: There is no gallstone, gallbladder wall thickening, or pericholecystic fluid. Negative sonographic Murphy's sign. There is a 4 mm gallbladder polyp. Common bile duct: Diameter: 2 mm Liver: No focal lesion identified. Within normal limits in parenchymal echogenicity. Portal vein is patent on color Doppler imaging with normal direction of blood flow towards the liver. Other: None. IMPRESSION: A 4 mm gallbladder polyp, otherwise unremarkable right upper quadrant ultrasound. Follow-up in 6-12 months recommended. Electronically Signed   By: Elgie Collard M.D.   On: 01/01/2020 02:18   Scheduled Meds: . enoxaparin (LOVENOX) injection  40 mg Subcutaneous Q24H  . guaiFENesin  1,200 mg Oral BID  . ipratropium  0.5 mg Nebulization Q6H  . levalbuterol  0.63 mg Nebulization Q6H  . potassium chloride  40 mEq Oral BID  . sodium chloride flush  3 mL Intravenous Q12H   Continuous Infusions: . Amphetamine-dextroamphetamine (ADDERALL XR) 25 mg    . azithromycin    . cefTRIAXone (ROCEPHIN)  IV Stopped (01/02/20 0711)    LOS: 1 day   Merlene Laughter, DO [facility] PAGER is on AMION  If 7PM-7AM, please contact night-coverage [email or web address]

## 2020-01-03 ENCOUNTER — Inpatient Hospital Stay (HOSPITAL_COMMUNITY): Payer: Medicaid Other

## 2020-01-03 DIAGNOSIS — J189 Pneumonia, unspecified organism: Secondary | ICD-10-CM

## 2020-01-03 DIAGNOSIS — A419 Sepsis, unspecified organism: Secondary | ICD-10-CM

## 2020-01-03 DIAGNOSIS — J9621 Acute and chronic respiratory failure with hypoxia: Secondary | ICD-10-CM

## 2020-01-03 DIAGNOSIS — F909 Attention-deficit hyperactivity disorder, unspecified type: Secondary | ICD-10-CM

## 2020-01-03 DIAGNOSIS — R0602 Shortness of breath: Secondary | ICD-10-CM

## 2020-01-03 DIAGNOSIS — R1011 Right upper quadrant pain: Secondary | ICD-10-CM

## 2020-01-03 LAB — VITAMIN B12: Vitamin B-12: 857 pg/mL (ref 180–914)

## 2020-01-03 LAB — COMPREHENSIVE METABOLIC PANEL
ALT: 12 U/L (ref 0–44)
AST: 12 U/L — ABNORMAL LOW (ref 15–41)
Albumin: 2.6 g/dL — ABNORMAL LOW (ref 3.5–5.0)
Alkaline Phosphatase: 64 U/L (ref 38–126)
Anion gap: 10 (ref 5–15)
BUN: 7 mg/dL (ref 6–20)
CO2: 19 mmol/L — ABNORMAL LOW (ref 22–32)
Calcium: 7.9 mg/dL — ABNORMAL LOW (ref 8.9–10.3)
Chloride: 108 mmol/L (ref 98–111)
Creatinine, Ser: 0.69 mg/dL (ref 0.44–1.00)
GFR calc Af Amer: >60 mL/min (ref >60)
GFR calc non Af Amer: >60 mL/min (ref >60)
Glucose, Bld: 91 mg/dL (ref 70–99)
Potassium: 4.1 mmol/L (ref 3.5–5.1)
Sodium: 137 mmol/L (ref 135–145)
Total Bilirubin: 0.4 mg/dL (ref 0.3–1.2)
Total Protein: 5.5 g/dL — ABNORMAL LOW (ref 6.5–8.1)

## 2020-01-03 LAB — IRON AND TIBC
Iron: 14 ug/dL — ABNORMAL LOW (ref 28–170)
Saturation Ratios: 7 % — ABNORMAL LOW (ref 10.4–31.8)
TIBC: 196 ug/dL — ABNORMAL LOW (ref 250–450)
UIBC: 182 ug/dL

## 2020-01-03 LAB — CBC WITH DIFFERENTIAL/PLATELET
Abs Immature Granulocytes: 0.13 10*3/uL — ABNORMAL HIGH (ref 0.00–0.07)
Basophils Absolute: 0 10*3/uL (ref 0.0–0.1)
Basophils Relative: 0 %
Eosinophils Absolute: 0.1 10*3/uL (ref 0.0–0.5)
Eosinophils Relative: 1 %
HCT: 31.6 % — ABNORMAL LOW (ref 36.0–46.0)
Hemoglobin: 10.3 g/dL — ABNORMAL LOW (ref 12.0–15.0)
Immature Granulocytes: 1 %
Lymphocytes Relative: 11 %
Lymphs Abs: 1.5 10*3/uL (ref 0.7–4.0)
MCH: 31 pg (ref 26.0–34.0)
MCHC: 32.6 g/dL (ref 30.0–36.0)
MCV: 95.2 fL (ref 80.0–100.0)
Monocytes Absolute: 0.8 10*3/uL (ref 0.1–1.0)
Monocytes Relative: 6 %
Neutro Abs: 11.1 10*3/uL — ABNORMAL HIGH (ref 1.7–7.7)
Neutrophils Relative %: 81 %
Platelets: 175 10*3/uL (ref 150–400)
RBC: 3.32 MIL/uL — ABNORMAL LOW (ref 3.87–5.11)
RDW: 13.4 % (ref 11.5–15.5)
WBC: 13.5 10*3/uL — ABNORMAL HIGH (ref 4.0–10.5)
nRBC: 0 % (ref 0.0–0.2)

## 2020-01-03 LAB — RETICULOCYTES
Immature Retic Fract: 13.2 % (ref 2.3–15.9)
RBC.: 3.3 MIL/uL — ABNORMAL LOW (ref 3.87–5.11)
Retic Count, Absolute: 25.1 10*3/uL (ref 19.0–186.0)
Retic Ct Pct: 0.8 % (ref 0.4–3.1)

## 2020-01-03 LAB — CULTURE, BLOOD (ROUTINE X 2): Special Requests: ADEQUATE

## 2020-01-03 LAB — MAGNESIUM: Magnesium: 1.8 mg/dL (ref 1.7–2.4)

## 2020-01-03 LAB — PHOSPHORUS: Phosphorus: 2.4 mg/dL — ABNORMAL LOW (ref 2.5–4.6)

## 2020-01-03 LAB — FOLATE: Folate: 9.9 ng/mL (ref >5.9)

## 2020-01-03 LAB — FERRITIN: Ferritin: 194 ng/mL (ref 11–307)

## 2020-01-03 MED ORDER — GUAIFENESIN-DM 100-10 MG/5ML PO SYRP
5.0000 mL | ORAL_SOLUTION | ORAL | Status: DC | PRN
  Administered 2020-01-03 – 2020-01-04 (×5): 5 mL via ORAL
  Filled 2020-01-03 (×5): qty 10

## 2020-01-03 MED ORDER — PENICILLIN G POTASSIUM 5000000 UNITS IJ SOLR
2.0000 10*6.[IU] | INTRAVENOUS | Status: DC
  Administered 2020-01-04 (×3): 2 10*6.[IU] via INTRAVENOUS
  Filled 2020-01-03 (×4): qty 2

## 2020-01-03 MED ORDER — FUROSEMIDE 10 MG/ML IJ SOLN
20.0000 mg | Freq: Once | INTRAMUSCULAR | Status: AC
  Administered 2020-01-03: 18:00:00 20 mg via INTRAVENOUS
  Filled 2020-01-03: qty 2

## 2020-01-03 NOTE — Progress Notes (Addendum)
[facility]  PROGRESS NOTE  Cassandra Benitez HLK:[id number] DOB: 21-May-1981 DOA: 01/01/2020 PCP: Lance Bosch, NP   Brief HPI:   39 year old female with past medical history of kidney stones, ADHD came to ED with complaints of fever, headache and generalized body aches.  COVID-19 RT-PCR was negative.  Chest x-ray confirmed right middle lobe pneumonia.  Patient was started on IV ceftriaxone and oral Zithromax.  Blood cultures grew strep pneumoniae.    Subjective   Patient seen and examined, complains of bloody stool earlier this morning.  Repeat bowel movement did not show blood in the stool.  Denies chest pain or shortness of breath.  O2 sats 97% on room air.  Chest x-ray shows worsening multifocal infiltrates, though clinically patient appears better than before.  She has +8 L fluid overload.   Assessment/Plan:     1. Strep pneumonia bacteremia/community-acquired pneumonia-patient was started on IV ceftriaxone and azithromycin.  Azithromycin has been discontinued.  Blood culture positive for strep pneumoniae, sensitive to penicillin and ceftriaxone.  Continue guaifenesin 1200 mg p.o. twice daily, as needed Xopenex and Atrovent.  WBC has improved from 23,000-13,000 today.  Influenza a/B PCR, SARS Covid 2 PCR negative.  Chest x-ray this morning shows worsening bilateral infiltrates, likely fluid overload.  Patient is +8 L fluid overload.  She is not requiring oxygen anymore.  O2 sats 97% on ambulation. 2. Pulmonary edema, mild-we will give Lasix 20 mg IV x1 tonight and assess response.  Chest x-ray does show multifocal infiltrates.  She does have bibasilar crackles on auscultation.  She is +8 L fluid overload. 3. ADHD-continue Adderall 4. Vaping-cessation counseling provided in the hospital. 5. ?  Bloody stool/anemia-anemia panel obtained shows iron saturation of 7%.  She will need work-up for anemia including EGD and colonoscopy once she is clinically stable.  Will start iron  supplementation at discharge.  Will obtain stool FOBT. 6. Hyperchloremic metabolic acidosis-CO2 is 19, chloride is 108.  Will discontinue IV fluids.  Patient given 1 dose of Lasix 20 mg IV as above.  Follow BMP in a.m.     SpO2: 97 %(patient walking in the hallway ) O2 Flow Rate (L/min): 1 L/min   COVID-19 Labs  Recent Labs    01/03/20 0536  FERRITIN 194    Lab Results  Component Value Date   SARSCOV2NAA NEGATIVE 01/01/2020     CBG: No results for input(s): GLUCAP in the last 168 hours.  CBC: Recent Labs  Lab 12/31/19 2246 01/02/20 0558 01/03/20 0536  WBC 28.2* 23.5* 13.5*  NEUTROABS  --   --  11.1*  HGB 13.5 10.6* 10.3*  HCT 40.4 32.0* 31.6*  MCV 93.5 93.8 95.2  PLT 194 158 175    Basic Metabolic Panel: Recent Labs  Lab 12/31/19 2246 01/02/20 0558 01/03/20 0536  NA 134* 135 137  K 3.8 3.3* 4.1  CL 101 107 108  CO2 23 21* 19*  GLUCOSE 109* 108* 91  BUN 13 8 7   CREATININE 0.87 0.80 0.69  CALCIUM 9.2 7.8* 7.9*  MG  --   --  1.8  PHOS  --   --  2.4*     Liver Function Tests: Recent Labs  Lab 12/31/19 2246 01/02/20 0558 01/03/20 0536  AST 17 12* 12*  ALT 18 13 12   ALKPHOS 66 62 64  BILITOT 0.8 0.5 0.4  PROT 7.1 5.2* 5.5*  ALBUMIN 4.1 2.6* 2.6*        DVT prophylaxis: Lovenox  Code Status: Full code  Family  Communication: Discussed with patient's significant other at bedside  Disposition Plan:   Status is: Inpatient  Dispo: The patient is from: Home              Anticipated d/c is to: Home              Anticipated d/c date is: 01/04/2020              Patient currently admitted for community-acquired pneumonia, strep pneumonia bacteremia.  Currently on IV ceftriaxone.  Barrier to discharge-work-up for bloody bowel movement and anemia, continuing IV ceftriaxone for strep pneumoniae bacteremia with pneumonia.        Scheduled medications:  . enoxaparin (LOVENOX) injection  40 mg Subcutaneous Q24H  . furosemide  20 mg Intravenous  Once  . guaiFENesin  1,200 mg Oral BID  . ipratropium  0.5 mg Nebulization BID  . levalbuterol  0.63 mg Nebulization BID  . sodium chloride flush  3 mL Intravenous Q12H    Consultants:  None  Procedures:  None  Antibiotics:   Anti-infectives (From admission, onward)   Start     Dose/Rate Route Frequency Ordered Stop   01/02/20 1715  azithromycin (ZITHROMAX) 500 mg in sodium chloride 0.9 % 250 mL IVPB  Status:  Discontinued     500 mg 250 mL/hr over 60 Minutes Intravenous Every 24 hours 01/02/20 1709 01/02/20 1714   01/02/20 1000  azithromycin (ZITHROMAX) 500 mg in sodium chloride 0.9 % 250 mL IVPB  Status:  Discontinued     500 mg 250 mL/hr over 60 Minutes Intravenous Every 24 hours 01/01/20 1033 01/01/20 2134   01/02/20 0600  cefTRIAXone (ROCEPHIN) 2 g in sodium chloride 0.9 % 100 mL IVPB     2 g 200 mL/hr over 30 Minutes Intravenous Every 24 hours 01/01/20 1033     01/01/20 0245  azithromycin (ZITHROMAX) tablet 500 mg     500 mg Oral  Once 01/01/20 0239 01/01/20 0255   01/01/20 0115  cefTRIAXone (ROCEPHIN) 2 g in sodium chloride 0.9 % 100 mL IVPB     2 g 200 mL/hr over 30 Minutes Intravenous  Once 01/01/20 0108 01/01/20 0231   01/01/20 0115  metroNIDAZOLE (FLAGYL) IVPB 500 mg     500 mg 100 mL/hr over 60 Minutes Intravenous  Once 01/01/20 0108 01/01/20 0330       Objective   Vitals:   01/03/20 0816 01/03/20 0940 01/03/20 0945 01/03/20 1228  BP:  121/82    Pulse: 93 76  94  Resp: (!) 26 (!) 24 19 (!) 22  Temp:  98.3 F (36.8 C)    TempSrc:  Oral    SpO2: 96% 100%  97%  Weight:      Height:        Intake/Output Summary (Last 24 hours) at 01/03/2020 1703 Last data filed at 01/03/2020 1600 Gross per 24 hour  Intake 2417.66 ml  Output --  Net 2417.66 ml    05/10 1901 - 05/12 0700 In: 3178.2 [P.O.:1200; I.V.:1878.2] Out: 0   Filed Weights   12/31/19 2239  Weight: 56.7 kg    Physical Examination:   General-appears in no acute distress Heart-S1-S2,  regular, no murmur auscultated Lungs-bibasilar crackles Abdomen-soft, nontender, no organomegaly Extremities-no edema in the lower extremities Neuro-alert, oriented x3, no focal deficit noted   Data Reviewed:   Recent Results (from the past 240 hour(s))  Respiratory Panel by RT PCR (Flu A&B, Covid) - Nasopharyngeal Swab     Status: None  Collection Time: 01/01/20  1:07 AM   Specimen: Nasopharyngeal Swab  Result Value Ref Range Status   SARS Coronavirus 2 by RT PCR NEGATIVE NEGATIVE Final    Comment: (NOTE) SARS-CoV-2 target nucleic acids are NOT DETECTED. The SARS-CoV-2 RNA is generally detectable in upper respiratoy specimens during the acute phase of infection. The lowest concentration of SARS-CoV-2 viral copies this assay can detect is 131 copies/mL. A negative result does not preclude SARS-Cov-2 infection and should not be used as the sole basis for treatment or other patient management decisions. A negative result may occur with  improper specimen collection/handling, submission of specimen other than nasopharyngeal swab, presence of viral mutation(s) within the areas targeted by this assay, and inadequate number of viral copies (<131 copies/mL). A negative result must be combined with clinical observations, patient history, and epidemiological information. The expected result is Negative. Fact Sheet for Patients:  https://www.moore.com/ Fact Sheet for Healthcare Providers:  https://www.young.biz/ This test is not yet ap proved or cleared by the [place] FDA and  has been authorized for detection and/or diagnosis of SARS-CoV-2 by FDA under an Emergency Use Authorization (EUA). This EUA will remain  in effect (meaning this test can be used) for the duration of the COVID-19 declaration under Section 564(b)(1) of the Act, 21 U.S.C. section 360bbb-3(b)(1), unless the authorization is terminated or revoked sooner.    Influenza A by  PCR NEGATIVE NEGATIVE Final   Influenza B by PCR NEGATIVE NEGATIVE Final    Comment: (NOTE) The Xpert Xpress SARS-CoV-2/FLU/RSV assay is intended as an aid in  the diagnosis of influenza from Nasopharyngeal swab specimens and  should not be used as a sole basis for treatment. Nasal washings and  aspirates are unacceptable for Xpert Xpress SARS-CoV-2/FLU/RSV  testing. Fact Sheet for Patients: https://www.moore.com/ Fact Sheet for Healthcare Providers: https://www.young.biz/ This test is not yet approved or cleared by the [place] FDA and  has been authorized for detection and/or diagnosis of SARS-CoV-2 by  FDA under an Emergency Use Authorization (EUA). This EUA will remain  in effect (meaning this test can be used) for the duration of the  Covid-19 declaration under Section 564(b)(1) of the Act, 21  U.S.C. section 360bbb-3(b)(1), unless the authorization is  terminated or revoked. Performed at [facility], [address], [place], [place] [address]   Urine culture     Status: None   Collection Time: 01/01/20  1:08 AM   Specimen: In/Out Cath Urine  Result Value Ref Range Status   Specimen Description   Final    IN/OUT CATH URINE Performed at [facility], [address] [place], [place] [address]    Special Requests   Final    NONE Performed at [facility], [address] [place], [place] [address]    Culture   Final    NO GROWTH Performed at [facility], [address], [place], [place] [address]    Report Status 01/01/2020 FINAL  Final  Blood Culture (routine x 2)     Status: Abnormal   Collection Time: 01/01/20  1:51 AM   Specimen: BLOOD  Result Value Ref Range Status   Specimen Description   Final    BLOOD RIGHT WRIST Performed at [facility], [address], [place], [place] [address]    Special Requests   Final    BOTTLES DRAWN AEROBIC  AND ANAEROBIC Blood Culture adequate volume Performed at [facility], [address] [place], [place] [address]    Culture  Setup Time   Final    ANAEROBIC BOTTLE ONLY GRAM POSITIVE COCCI CRITICAL VALUE NOTED.  VALUE IS CONSISTENT WITH PREVIOUSLY REPORTED AND CALLED VALUE.    Culture (A)  Final    STREPTOCOCCUS PNEUMONIAE SUSCEPTIBILITIES PERFORMED ON PREVIOUS CULTURE WITHIN THE LAST 5 DAYS. Performed at [facility], [address] [place], [place] [address]    Report Status 01/03/2020 FINAL  Final  Blood Culture (routine x 2)     Status: Abnormal   Collection Time: 01/01/20  1:51 AM   Specimen: BLOOD  Result Value Ref Range Status   Specimen Description   Final    BLOOD LEFT ANTECUBITAL Performed at [facility], [address] [place], [place] [address]    Special Requests   Final    BOTTLES DRAWN AEROBIC AND ANAEROBIC Blood Culture results may not be optimal due to an excessive volume of blood received in culture bottles Performed at [facility], [address] [place], [place] [address]    Culture  Setup Time   Final    IN BOTH AEROBIC AND ANAEROBIC BOTTLES GRAM POSITIVE COCCI CRITICAL RESULT CALLED TO, READ BACK BY AND VERIFIED WITH: Sophronia SimasM BELL [facility] 01/01/20 2111 JDW Performed at [facility], [address] [place], [place] [address]    Culture STREPTOCOCCUS PNEUMONIAE (A)  Final   Report Status 01/03/2020 FINAL  Final   Organism ID, Bacteria STREPTOCOCCUS PNEUMONIAE  Final      Susceptibility   Streptococcus pneumoniae - MIC*    ERYTHROMYCIN >=8 RESISTANT Resistant     LEVOFLOXACIN 1 SENSITIVE Sensitive     VANCOMYCIN 0.5 SENSITIVE Sensitive     PENICILLIN (meningitis) <=0.06 SENSITIVE Sensitive     PENO - penicillin <=0.06      PENICILLIN (non-meningitis) <=0.06 SENSITIVE Sensitive     PENICILLIN (oral) <=0.06 SENSITIVE Sensitive     CEFTRIAXONE (non-meningitis) <=0.12 SENSITIVE Sensitive      CEFTRIAXONE (meningitis) <=0.12 SENSITIVE Sensitive     * STREPTOCOCCUS PNEUMONIAE  Blood Culture ID Panel (Reflexed)     Status: Abnormal   Collection Time: 01/01/20  1:51 AM  Result Value Ref Range Status   Enterococcus species NOT DETECTED NOT DETECTED Final   Listeria monocytogenes NOT DETECTED NOT DETECTED Final   Staphylococcus species NOT DETECTED NOT DETECTED Final   Staphylococcus aureus (BCID) NOT DETECTED NOT DETECTED Final   Streptococcus species DETECTED (A) NOT DETECTED Final    Comment: CRITICAL RESULT CALLED TO, READ BACK BY AND VERIFIED WITH: M BELL PHARMD 5/10 21 2111 JDW    Streptococcus agalactiae NOT DETECTED NOT DETECTED Final   Streptococcus pneumoniae DETECTED (A) NOT DETECTED Final    Comment: CRITICAL RESULT CALLED TO, READ BACK BY AND VERIFIED WITH: Sophronia SimasM BELL PHARMD 01/01/20 2111 JDW    Streptococcus pyogenes NOT DETECTED NOT DETECTED Final   Acinetobacter baumannii NOT DETECTED NOT DETECTED Final   Enterobacteriaceae species NOT DETECTED NOT DETECTED Final   Enterobacter cloacae complex NOT DETECTED NOT DETECTED Final   Escherichia coli NOT DETECTED NOT DETECTED Final   Klebsiella oxytoca NOT DETECTED NOT DETECTED Final   Klebsiella pneumoniae NOT DETECTED NOT DETECTED Final   Proteus species NOT DETECTED NOT DETECTED Final   Serratia marcescens NOT DETECTED NOT DETECTED Final   Haemophilus influenzae NOT DETECTED NOT DETECTED Final   Neisseria meningitidis NOT DETECTED NOT DETECTED Final   Pseudomonas aeruginosa NOT DETECTED NOT DETECTED Final  Candida albicans NOT DETECTED NOT DETECTED Final   Candida glabrata NOT DETECTED NOT DETECTED Final   Candida krusei NOT DETECTED NOT DETECTED Final   Candida parapsilosis NOT DETECTED NOT DETECTED Final   Candida tropicalis NOT DETECTED NOT DETECTED Final    Comment: Performed at [facility], [address] [place], [place] [address]    Recent Labs  Lab 12/31/19 2246  LIPASE 19    Studies:  DG  CHEST PORT 1 VIEW  Result Date: 01/03/2020 CLINICAL DATA:  Cough and shortness of breath. EXAM: PORTABLE CHEST 1 VIEW COMPARISON:  Chest x-ray dated Jan 01, 2020. FINDINGS: The heart size and mediastinal contours are within normal limits. Normal pulmonary vascularity. Worsening right middle and bilateral lower lobe consolidations. New small left pleural effusion. No pneumothorax. No acute osseous abnormality. IMPRESSION: 1. Worsened multifocal pneumonia. 2. New small left pleural effusion. Electronically Signed   By: Obie Dredge M.D.   On: 01/03/2020 07:05       Chapel Silverthorn S Milbert Bixler   [facility] If 7PM-7AM, please contact night-coverage at [email or web address], Office  [phone]   01/03/2020, 5:03 PM  LOS: 2 days

## 2020-01-03 NOTE — Progress Notes (Signed)
01/03/2020  1239  Patient notified NT that she had a BM with blood. Patient had already flushed toilet. NT placed hat in toilet to catch next BM. MD notified.

## 2020-01-03 NOTE — Progress Notes (Signed)
01/03/2020  1600  Patient had a BM in the hat in the toilet. Brown stool No blood was visible in the stool. Patient states the time before it was chunks of blood.

## 2020-01-03 NOTE — Progress Notes (Signed)
01/03/2020  0945  Full set of vital signs taken. Pt MEWS score is green. No new actions taken.

## 2020-01-03 NOTE — Plan of Care (Signed)
  Problem: Nutrition: Goal: Adequate nutrition will be maintained Outcome: Progressing   Problem: Coping: Goal: Level of anxiety will decrease Outcome: Progressing   Problem: Elimination: Goal: Will not experience complications related to bowel motility Outcome: Progressing   Problem: Elimination: Goal: Will not experience complications related to urinary retention Outcome: Progressing   

## 2020-01-04 LAB — CBC
HCT: 34 % — ABNORMAL LOW (ref 36.0–46.0)
Hemoglobin: 11.1 g/dL — ABNORMAL LOW (ref 12.0–15.0)
MCH: 30.3 pg (ref 26.0–34.0)
MCHC: 32.6 g/dL (ref 30.0–36.0)
MCV: 92.9 fL (ref 80.0–100.0)
Platelets: 197 10*3/uL (ref 150–400)
RBC: 3.66 MIL/uL — ABNORMAL LOW (ref 3.87–5.11)
RDW: 13.2 % (ref 11.5–15.5)
WBC: 8.8 10*3/uL (ref 4.0–10.5)
nRBC: 0 % (ref 0.0–0.2)

## 2020-01-04 LAB — BASIC METABOLIC PANEL
Anion gap: 11 (ref 5–15)
BUN: 7 mg/dL (ref 6–20)
CO2: 21 mmol/L — ABNORMAL LOW (ref 22–32)
Calcium: 8.4 mg/dL — ABNORMAL LOW (ref 8.9–10.3)
Chloride: 106 mmol/L (ref 98–111)
Creatinine, Ser: 0.66 mg/dL (ref 0.44–1.00)
GFR calc Af Amer: >60 mL/min (ref >60)
GFR calc non Af Amer: >60 mL/min (ref >60)
Glucose, Bld: 97 mg/dL (ref 70–99)
Potassium: 3.6 mmol/L (ref 3.5–5.1)
Sodium: 138 mmol/L (ref 135–145)

## 2020-01-04 LAB — OCCULT BLOOD X 1 CARD TO LAB, STOOL: Fecal Occult Bld: POSITIVE — AB

## 2020-01-04 MED ORDER — FUROSEMIDE 10 MG/ML IJ SOLN
20.0000 mg | Freq: Once | INTRAMUSCULAR | Status: AC
  Administered 2020-01-04: 20 mg via INTRAVENOUS
  Filled 2020-01-04: qty 2

## 2020-01-04 MED ORDER — GUAIFENESIN ER 600 MG PO TB12: 1200.0000 mg | ORAL_TABLET | Freq: Two times a day (BID) | ORAL | 0 refills | Status: AC

## 2020-01-04 MED ORDER — AMOXICILLIN 500 MG PO TABS
1000.0000 mg | ORAL_TABLET | Freq: Three times a day (TID) | ORAL | 0 refills | Status: AC
Start: 2020-01-04 — End: 2020-01-11

## 2020-01-04 NOTE — Progress Notes (Signed)
Went over discharge instructions w/ pt. Pt verbalized understanding.  

## 2020-01-04 NOTE — Discharge Summary (Addendum)
Physician Discharge Summary  Cassandra Benitez DDU:[id number] DOB: [birth year] DOA: 01/01/2020  PCP: Ferd Hibbs, NP  Admit date: 01/01/2020 Discharge date: 01/04/2020  Time spent: 50 minutes  Recommendations for Outpatient Follow-up:  Follow-up gastroenterology, Dr. Arta Silence in 2 weeks Follow-up PCP in 1 week  Discharge Diagnoses:  Principal Problem:   Sepsis due to pneumonia [facility]) Active Problems:   CAP (community acquired pneumonia)   ADHD   Acute on chronic respiratory failure with hypoxia [facility])   Discharge Condition: Stable  Diet recommendation: Regular diet  Filed Weights   12/31/19 2239  Weight: 56.7 kg    History of present illness:  39 year old female with past medical history of kidney stones, ADHD came to ED with complaints of fever, headache and generalized body aches.  COVID-19 RT-PCR was negative.  Chest x-ray confirmed right middle lobe pneumonia.  Patient was started on IV ceftriaxone and oral Zithromax.  Blood cultures grew strep pneumoniae.  Hospital Course:   1. Strep pneumonia bacteremia/community-acquired pneumonia-patient was started on IV ceftriaxone and azithromycin.  Azithromycin has been discontinued.  Blood culture positive for strep pneumoniae, sensitive to penicillin and ceftriaxone.  She was started on guaifenesin 1200 mg p.o. twice daily, as needed Xopenex and Atrovent.  WBC has improved from 23,000- 8000 today.  Influenza a/B PCR, SARS Covid 2 PCR negative.  Repeat Chest x-ray yesterday showed bilateral infiltrates, likely fluid overload.  Patient is +8 L fluid overload.  She is not requiring oxygen anymore.  O2 sats 97% on ambulation.  Patient will be discharged on amoxicillin 1 g p.o. 3 times daily for 7 more days.  2. Pulmonary edema, mild-patient diuresed well after she received 1 dose of Lasix 20 mg IV.   Chest x-ray does show multifocal infiltrates.  She does have bibasilar crackles on auscultation.    Will give additional dose of Lasix 20  mg IV today before discharge.    3. ADHD-continue Adderall.  4. Vaping-cessation counseling provided in the hospital.  5. ?  Bloody stool/anemia-anemia panel obtained shows iron saturation of 7%.  She will need work-up for anemia including EGD and colonoscopy once she is clinically stable.  Stool FOBT is positive.  I have called [facility], Dr. Arta Silence.  He will see patient as outpatient.  This morning hemoglobin is stable at 1.3.  Patient is hemodynamically stable.  6. Hyperchloremic metabolic acidosis-CO2 is 21 today.  Improved after stopping IV fluids and after patient received Lasix.  Procedures:   Consultations:    Discharge Exam: Vitals:   01/04/20 0552 01/04/20 0753  BP: 119/77   Pulse: 68   Resp: 18   Temp: 98.5 F (36.9 C)   SpO2: 95% 95%    General: Appears in no acute distress Cardiovascular: S1-S2, regular Respiratory: Faint crackles bilaterally at lung bases  Discharge Instructions   Discharge Instructions    Diet - low sodium heart healthy   Complete by: As directed    Increase activity slowly   Complete by: As directed      Allergies as of 01/04/2020   No Known Allergies     Medication List    TAKE these medications   acetaminophen 325 MG tablet Commonly known as: TYLENOL Take 325 mg by mouth every 6 (six) hours as needed for mild pain or headache.   Adderall XR 25 MG 24 hr capsule Generic drug: amphetamine-dextroamphetamine Take 25 mg by mouth every morning.   amoxicillin 500 MG tablet Commonly known as: AMOXIL Take 2 tablets (1,000 mg  total) by mouth in the morning, at noon, and at bedtime for 7 days.   guaiFENesin 600 MG 12 hr tablet Commonly known as: MUCINEX Take 2 tablets (1,200 mg total) by mouth 2 (two) times daily for 5 days.   Mirena (52 MG) 20 MCG/24HR IUD Generic drug: levonorgestrel by Intrauterine route once for 1 dose.      No Known Allergies Follow-up Information    Willis Modenautlaw, William, MD. Schedule an appointment  as soon as possible for a visit in 2 week(s).   Specialty: Gastroenterology Contact information: [address] [place] [address] [phone]        Lance BoschBlevins, Andrea, NP Follow up in 1 week(s).   Specialty: Nurse Practitioner Contact information: [address] [place] [address] [phone]            The results of significant diagnostics from this hospitalization (including imaging, microbiology, ancillary and laboratory) are listed below for reference.    Significant Diagnostic Studies: CT ABDOMEN PELVIS W CONTRAST  Result Date: 01/01/2020 CLINICAL DATA:  Right upper quadrant pain. EXAM: CT ABDOMEN AND PELVIS WITH CONTRAST TECHNIQUE: Multidetector CT imaging of the abdomen and pelvis was performed using the standard protocol following bolus administration of intravenous contrast. CONTRAST:  100mL OMNIPAQUE IOHEXOL 300 MG/ML  SOLN COMPARISON:  Right upper quadrant ultrasound from same day. FINDINGS: Lower chest: Right middle lobe consolidation. Patchy central consolidations in both lower lobes. Hepatobiliary: No focal liver abnormality is seen. Trace pericholecystic fluid. No gallstones, gallbladder wall thickening, or biliary dilatation. Pancreas: Unremarkable. No pancreatic ductal dilatation or surrounding inflammatory changes. Spleen: Normal in size without focal abnormality. Adrenals/Urinary Tract: Adrenal glands are unremarkable. Kidneys are normal, without renal calculi, focal lesion, or hydronephrosis. Bladder is decompressed. Stomach/Bowel: Stomach is within normal limits. Appendix appears normal. No evidence of bowel wall thickening, distention, or inflammatory changes. Vascular/Lymphatic: No significant vascular findings are present. No enlarged abdominal or pelvic lymph nodes. Reproductive: Uterus and bilateral adnexa are unremarkable. IUD within the endometrial canal. Other: Trace free fluid in the pelvis, likely physiologic. No pneumoperitoneum.  Musculoskeletal: No acute or significant osseous findings. IMPRESSION: 1. Multifocal pneumonia. 2. Trace pericholecystic fluid, likely incidental given otherwise normal right upper quadrant ultrasound from earlier this morning. Electronically Signed   By: Obie DredgeWilliam T Derry M.D.   On: 01/01/2020 05:49   DG CHEST PORT 1 VIEW  Result Date: 01/03/2020 CLINICAL DATA:  Cough and shortness of breath. EXAM: PORTABLE CHEST 1 VIEW COMPARISON:  Chest x-ray dated Jan 01, 2020. FINDINGS: The heart size and mediastinal contours are within normal limits. Normal pulmonary vascularity. Worsening right middle and bilateral lower lobe consolidations. New small left pleural effusion. No pneumothorax. No acute osseous abnormality. IMPRESSION: 1. Worsened multifocal pneumonia. 2. New small left pleural effusion. Electronically Signed   By: Obie DredgeWilliam T Derry M.D.   On: 01/03/2020 07:05   DG Chest Port 1 View  Result Date: 01/01/2020 CLINICAL DATA:  39 year old female with cough. EXAM: PORTABLE CHEST 1 VIEW COMPARISON:  None FINDINGS: Focal right middle lobe density with silhouetting of the right cardiac border. The left lung is clear. No pleural effusion or pneumothorax. The cardiac silhouette is within limits. No acute osseous pathology. IMPRESSION: Right middle lobe pneumonia. Electronically Signed   By: Elgie CollardArash  Radparvar M.D.   On: 01/01/2020 01:37   US Abdomen Limited RUQ  Result Date: 01/01/2020 CLINICAL DATA:  39 year old female with right upper quadrant abdominal pain. EXAM: ULTRASOUND ABDOMEN LIMITED RIGHT UPPER QUADRANT COMPARISON:  None. FINDINGS:  Gallbladder: There is no gallstone, gallbladder wall thickening, or pericholecystic fluid. Negative sonographic Murphy's sign. There is a 4 mm gallbladder polyp. Common bile duct: Diameter: 2 mm Liver: No focal lesion identified. Within normal limits in parenchymal echogenicity. Portal vein is patent on color Doppler imaging with normal direction of blood flow towards the liver.  Other: None. IMPRESSION: A 4 mm gallbladder polyp, otherwise unremarkable right upper quadrant ultrasound. Follow-up in 6-12 months recommended. Electronically Signed   By: Elgie Collard M.D.   On: 01/01/2020 02:18    Microbiology: Recent Results (from the past 240 hour(s))  Respiratory Panel by RT PCR (Flu A&B, Covid) - Nasopharyngeal Swab     Status: None   Collection Time: 01/01/20  1:07 AM   Specimen: Nasopharyngeal Swab  Result Value Ref Range Status   SARS Coronavirus 2 by RT PCR NEGATIVE NEGATIVE Final    Comment: (NOTE) SARS-CoV-2 target nucleic acids are NOT DETECTED. The SARS-CoV-2 RNA is generally detectable in upper respiratoy specimens during the acute phase of infection. The lowest concentration of SARS-CoV-2 viral copies this assay can detect is 131 copies/mL. A negative result does not preclude SARS-Cov-2 infection and should not be used as the sole basis for treatment or other patient management decisions. A negative result may occur with  improper specimen collection/handling, submission of specimen other than nasopharyngeal swab, presence of viral mutation(s) within the areas targeted by this assay, and inadequate number of viral copies (<131 copies/mL). A negative result must be combined with clinical observations, patient history, and epidemiological information. The expected result is Negative. Fact Sheet for Patients:  https://www.moore.com/ Fact Sheet for Healthcare Providers:  https://www.young.biz/ This test is not yet ap proved or cleared by the [place] FDA and  has been authorized for detection and/or diagnosis of SARS-CoV-2 by FDA under an Emergency Use Authorization (EUA). This EUA will remain  in effect (meaning this test can be used) for the duration of the COVID-19 declaration under Section 564(b)(1) of the Act, 21 U.S.C. section 360bbb-3(b)(1), unless the authorization is terminated or revoked sooner.     Influenza A by PCR NEGATIVE NEGATIVE Final   Influenza B by PCR NEGATIVE NEGATIVE Final    Comment: (NOTE) The Xpert Xpress SARS-CoV-2/FLU/RSV assay is intended as an aid in  the diagnosis of influenza from Nasopharyngeal swab specimens and  should not be used as a sole basis for treatment. Nasal washings and  aspirates are unacceptable for Xpert Xpress SARS-CoV-2/FLU/RSV  testing. Fact Sheet for Patients: https://www.moore.com/ Fact Sheet for Healthcare Providers: https://www.young.biz/ This test is not yet approved or cleared by the [place] FDA and  has been authorized for detection and/or diagnosis of SARS-CoV-2 by  FDA under an Emergency Use Authorization (EUA). This EUA will remain  in effect (meaning this test can be used) for the duration of the  Covid-19 declaration under Section 564(b)(1) of the Act, 21  U.S.C. section 360bbb-3(b)(1), unless the authorization is  terminated or revoked. Performed at [facility], [address], [place], [place] [address]   Urine culture     Status: None   Collection Time: 01/01/20  1:08 AM   Specimen: In/Out Cath Urine  Result Value Ref Range Status   Specimen Description   Final    IN/OUT CATH URINE Performed at [facility], [address], [place], [place] [address]    Special Requests   Final    NONE Performed at [facility], [address] [place],  [place] [address]    Culture   Final    NO GROWTH Performed at [facility], [address] [place], [place] [address]    Report Status 01/01/2020 FINAL  Final  Blood Culture (routine x 2)     Status: Abnormal   Collection Time: 01/01/20  1:51 AM   Specimen: BLOOD  Result Value Ref Range Status   Specimen Description   Final    BLOOD RIGHT WRIST Performed at [facility], [address] [place], [place] [address]    Special Requests   Final     BOTTLES DRAWN AEROBIC AND ANAEROBIC Blood Culture adequate volume Performed at [facility], [address] [place], [place] [address]    Culture  Setup Time   Final    ANAEROBIC BOTTLE ONLY GRAM POSITIVE COCCI CRITICAL VALUE NOTED.  VALUE IS CONSISTENT WITH PREVIOUSLY REPORTED AND CALLED VALUE.    Culture (A)  Final    STREPTOCOCCUS PNEUMONIAE SUSCEPTIBILITIES PERFORMED ON PREVIOUS CULTURE WITHIN THE LAST 5 DAYS. Performed at [facility], [address], [place], [place] [address]    Report Status 01/03/2020 FINAL  Final  Blood Culture (routine x 2)     Status: Abnormal   Collection Time: 01/01/20  1:51 AM   Specimen: BLOOD  Result Value Ref Range Status   Specimen Description   Final    BLOOD LEFT ANTECUBITAL Performed at [facility], [address], [place], [place] [address]    Special Requests   Final    BOTTLES DRAWN AEROBIC AND ANAEROBIC Blood Culture results may not be optimal due to an excessive volume of blood received in culture bottles Performed at [facility], [address] [place], [place] [address]    Culture  Setup Time   Final    IN BOTH AEROBIC AND ANAEROBIC BOTTLES GRAM POSITIVE COCCI CRITICAL RESULT CALLED TO, READ BACK BY AND VERIFIED WITH: Sophronia Simas [facility] 01/01/20 2111 JDW Performed at [facility], [address] [place], [place] [address]    Culture STREPTOCOCCUS PNEUMONIAE (A)  Final   Report Status 01/03/2020 FINAL  Final   Organism ID, Bacteria STREPTOCOCCUS PNEUMONIAE  Final      Susceptibility   Streptococcus pneumoniae - MIC*    ERYTHROMYCIN >=8 RESISTANT Resistant     LEVOFLOXACIN 1 SENSITIVE Sensitive     VANCOMYCIN 0.5 SENSITIVE Sensitive     PENICILLIN (meningitis) <=0.06 SENSITIVE Sensitive     PENO - penicillin <=0.06      PENICILLIN (non-meningitis) <=0.06 SENSITIVE Sensitive     PENICILLIN (oral) <=0.06 SENSITIVE Sensitive     CEFTRIAXONE (non-meningitis) <=0.12  SENSITIVE Sensitive     CEFTRIAXONE (meningitis) <=0.12 SENSITIVE Sensitive     * STREPTOCOCCUS PNEUMONIAE  Blood Culture ID Panel (Reflexed)     Status: Abnormal   Collection Time: 01/01/20  1:51 AM  Result Value Ref Range Status   Enterococcus species NOT DETECTED NOT DETECTED Final   Listeria monocytogenes NOT DETECTED NOT DETECTED Final   Staphylococcus species NOT DETECTED NOT DETECTED Final   Staphylococcus aureus (BCID) NOT DETECTED NOT DETECTED Final   Streptococcus species DETECTED (A) NOT DETECTED Final    Comment: CRITICAL RESULT CALLED TO, READ BACK BY AND VERIFIED WITH: M BELL PHARMD 5/10 21 2111 JDW    Streptococcus agalactiae NOT DETECTED NOT DETECTED Final   Streptococcus pneumoniae DETECTED (A) NOT DETECTED Final    Comment: CRITICAL RESULT CALLED TO, READ  BACK BY AND VERIFIED WITH: Sophronia Simas [facility] 01/01/20 2111 JDW    Streptococcus pyogenes NOT DETECTED NOT DETECTED Final   Acinetobacter baumannii NOT DETECTED NOT DETECTED Final   Enterobacteriaceae species NOT DETECTED NOT DETECTED Final   Enterobacter cloacae complex NOT DETECTED NOT DETECTED Final   Escherichia coli NOT DETECTED NOT DETECTED Final   Klebsiella oxytoca NOT DETECTED NOT DETECTED Final   Klebsiella pneumoniae NOT DETECTED NOT DETECTED Final   Proteus species NOT DETECTED NOT DETECTED Final   Serratia marcescens NOT DETECTED NOT DETECTED Final   Haemophilus influenzae NOT DETECTED NOT DETECTED Final   Neisseria meningitidis NOT DETECTED NOT DETECTED Final   Pseudomonas aeruginosa NOT DETECTED NOT DETECTED Final   Candida albicans NOT DETECTED NOT DETECTED Final   Candida glabrata NOT DETECTED NOT DETECTED Final   Candida krusei NOT DETECTED NOT DETECTED Final   Candida parapsilosis NOT DETECTED NOT DETECTED Final   Candida tropicalis NOT DETECTED NOT DETECTED Final    Comment: Performed at [facility], [address], [place], [place] [address]     Labs: Basic Metabolic Panel: Recent Labs   Lab 12/31/19 2246 01/02/20 0558 01/03/20 0536 01/04/20 0546  NA 134* 135 137 138  K 3.8 3.3* 4.1 3.6  CL 101 107 108 106  CO2 23 21* 19* 21*  GLUCOSE 109* 108* 91 97  BUN 13 8 7 7   CREATININE 0.87 0.80 0.69 0.66  CALCIUM 9.2 7.8* 7.9* 8.4*  MG  --   --  1.8  --   PHOS  --   --  2.4*  --    Liver Function Tests: Recent Labs  Lab 12/31/19 2246 01/02/20 0558 01/03/20 0536  AST 17 12* 12*  ALT 18 13 12   ALKPHOS 66 62 64  BILITOT 0.8 0.5 0.4  PROT 7.1 5.2* 5.5*  ALBUMIN 4.1 2.6* 2.6*   Recent Labs  Lab 12/31/19 2246  LIPASE 19   No results for input(s): AMMONIA in the last 168 hours. CBC: Recent Labs  Lab 12/31/19 2246 01/02/20 0558 01/03/20 0536 01/04/20 0546  WBC 28.2* 23.5* 13.5* 8.8  NEUTROABS  --   --  11.1*  --   HGB 13.5 10.6* 10.3* 11.1*  HCT 40.4 32.0* 31.6* 34.0*  MCV 93.5 93.8 95.2 92.9  PLT 194 158 175 197       Signed:  03/04/20 MD.  [facility] 01/04/2020, 10:43 AM

## 2020-05-03 ENCOUNTER — Other Ambulatory Visit (HOSPITAL_COMMUNITY): Payer: Self-pay | Admitting: Nurse Practitioner

## 2020-05-03 DIAGNOSIS — M79604 Pain in right leg: Secondary | ICD-10-CM

## 2020-05-06 ENCOUNTER — Other Ambulatory Visit: Payer: Self-pay

## 2020-05-06 ENCOUNTER — Ambulatory Visit (HOSPITAL_COMMUNITY)
Admission: RE | Admit: 2020-05-06 | Discharge: 2020-05-06 | Disposition: A | Discharge status: Home / Self Care | Payer: Medicaid Other | Source: Ambulatory Visit | Attending: Nurse Practitioner | Admitting: Nurse Practitioner

## 2020-05-06 DIAGNOSIS — M7989 Other specified soft tissue disorders: Secondary | ICD-10-CM | POA: Diagnosis not present

## 2020-05-06 DIAGNOSIS — M79604 Pain in right leg: Secondary | ICD-10-CM | POA: Diagnosis not present

## 2020-05-06 NOTE — Progress Notes (Signed)
Right lower Ext. study completed.   See CVProc for preliminary results.   Gabbie Marzo, RDMS, RVT 

## 2020-05-18 IMAGING — DX DG CHEST 1V PORT
1 series · 1 of 1 positions shown · non-contrast
Comparison: None

CLINICAL DATA: 39-year-old female with cough.

EXAM:
PORTABLE CHEST 1 VIEW

[chest ap]
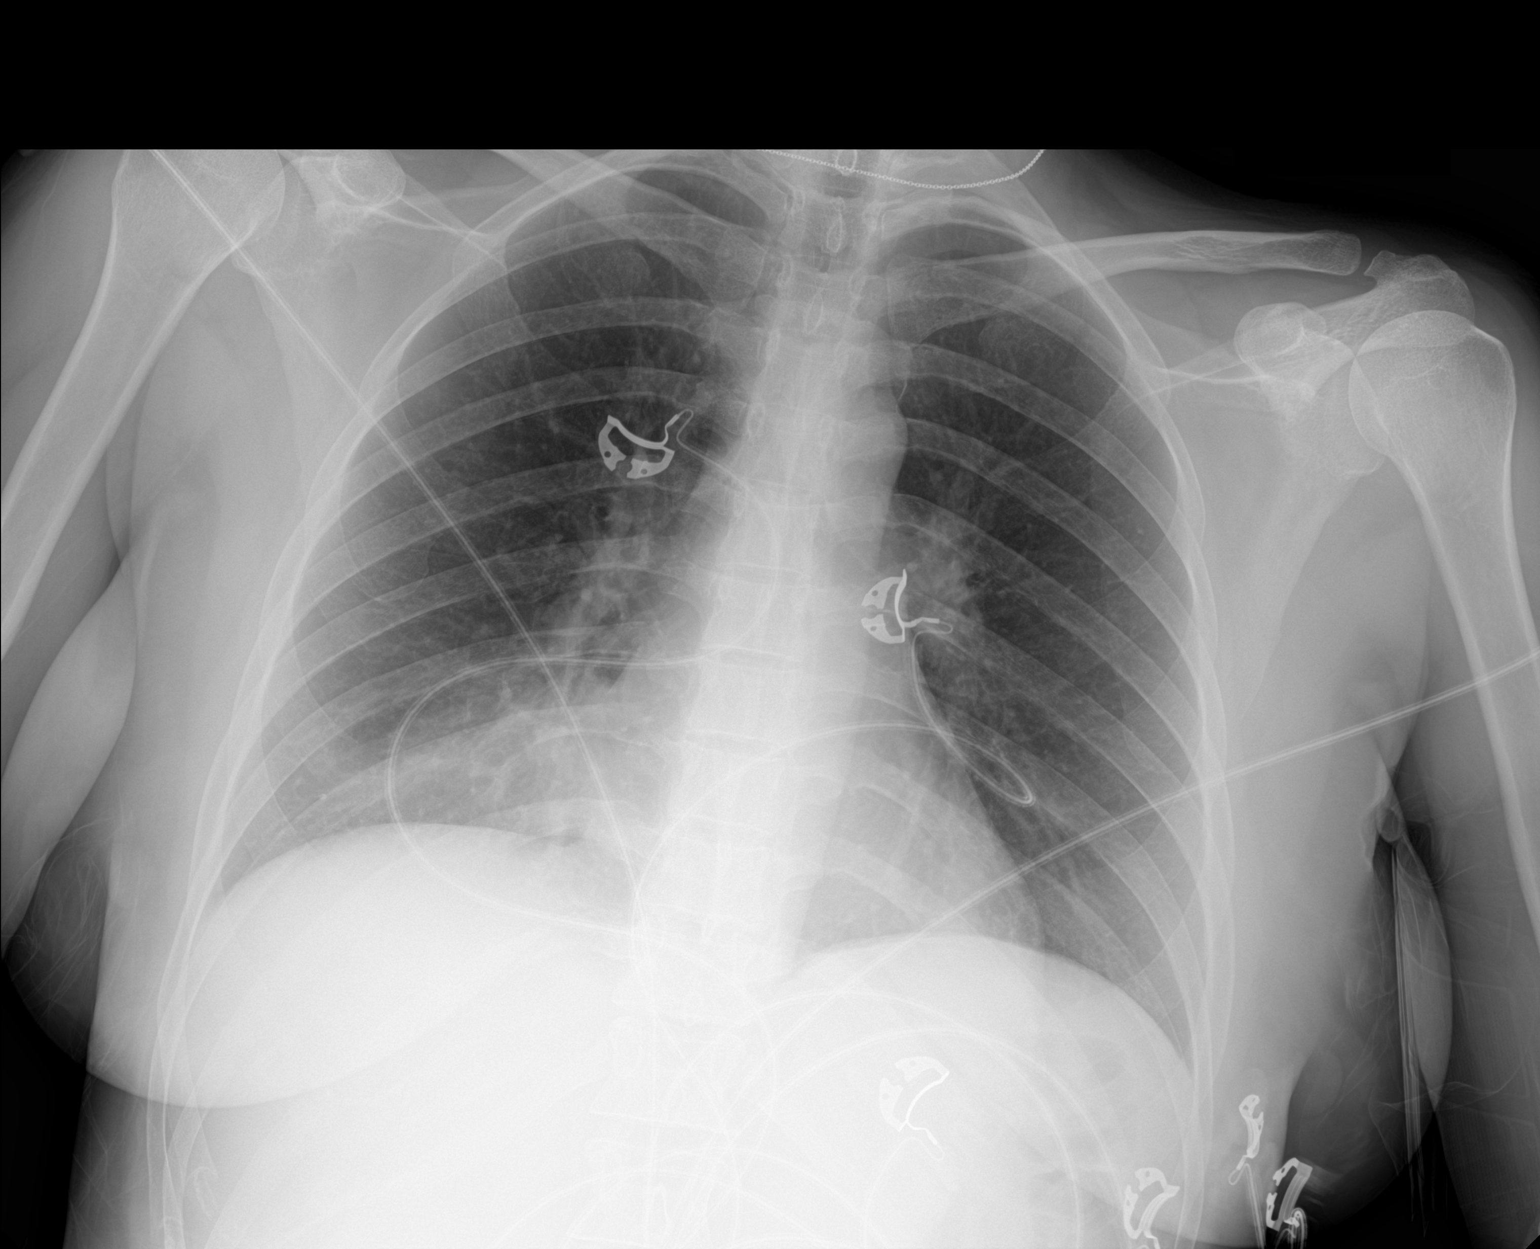

[1 of 1 positions shown; findings below may reference images not displayed]

FINDINGS: Focal right middle lobe density with silhouetting of the right
cardiac border. The left lung is clear. No pleural effusion or
pneumothorax. The cardiac silhouette is within limits. No acute
osseous pathology.
IMPRESSION: Right middle lobe pneumonia.

## 2020-05-18 IMAGING — US US ABDOMEN LIMITED
1 series · 14 of 25 positions shown · non-contrast
Comparison: None.

CLINICAL DATA: 39-year-old female with right upper quadrant
abdominal pain.

EXAM:
ULTRASOUND ABDOMEN LIMITED RIGHT UPPER QUADRANT

[Series 1: us abdomen limited · 14 of 45 slices shown]
[im 1/45]
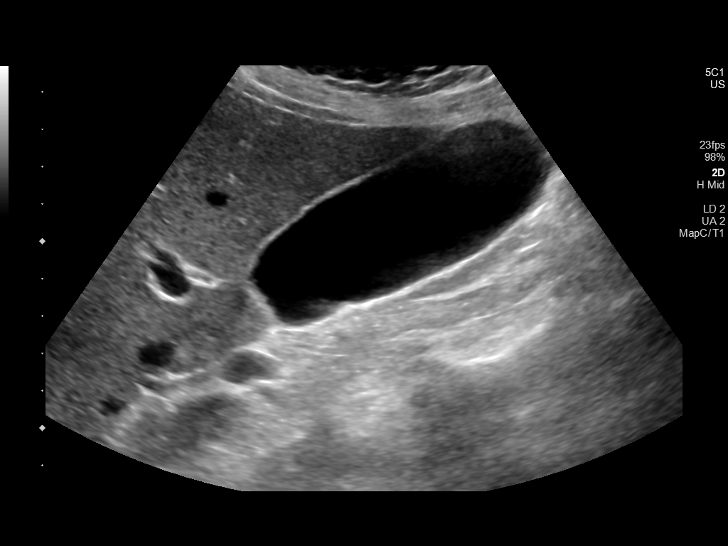
[im 4/45]
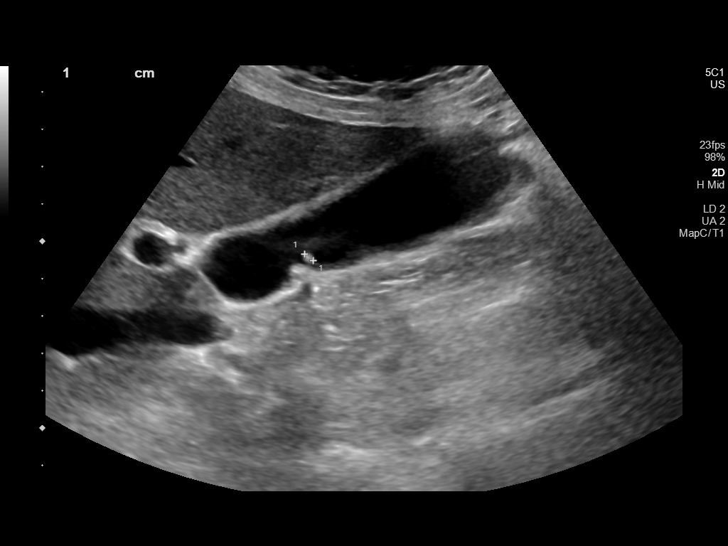
[im 8/45]
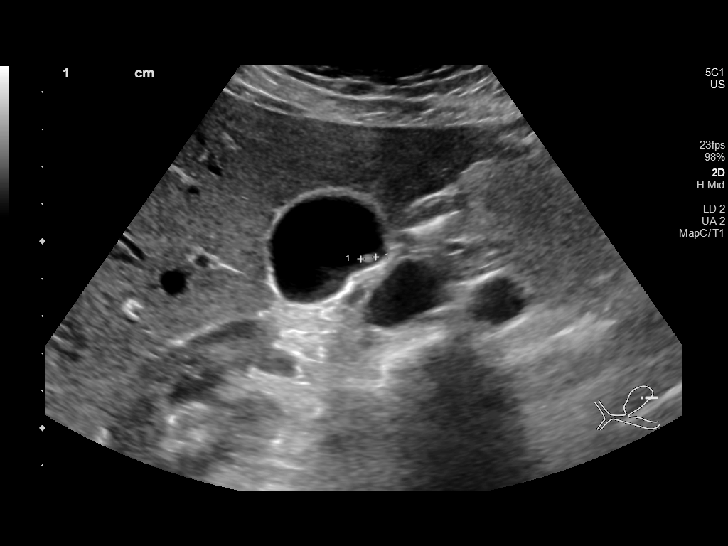
[im 12/45]
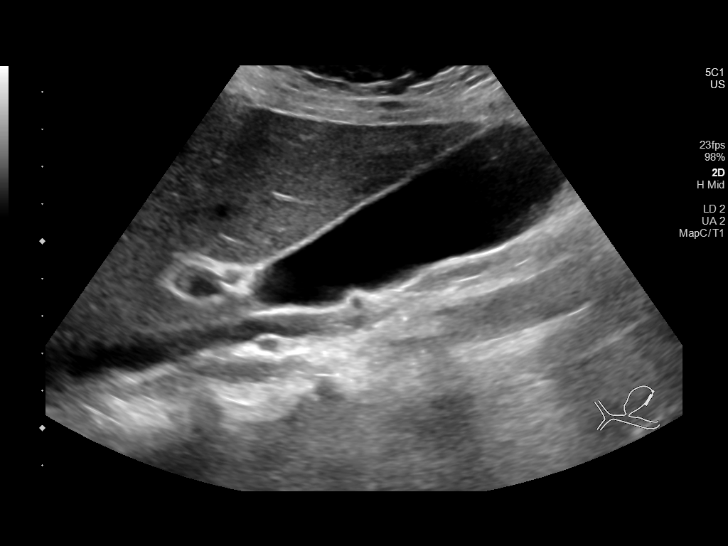
[im 15/45]
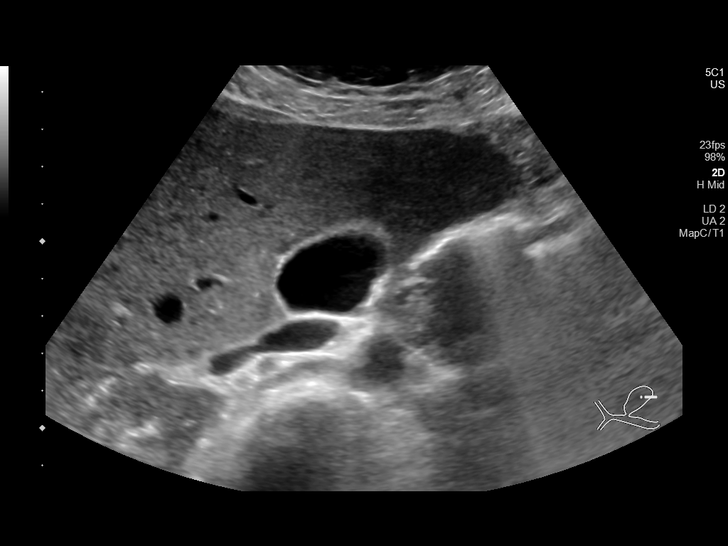
[im 17/45]
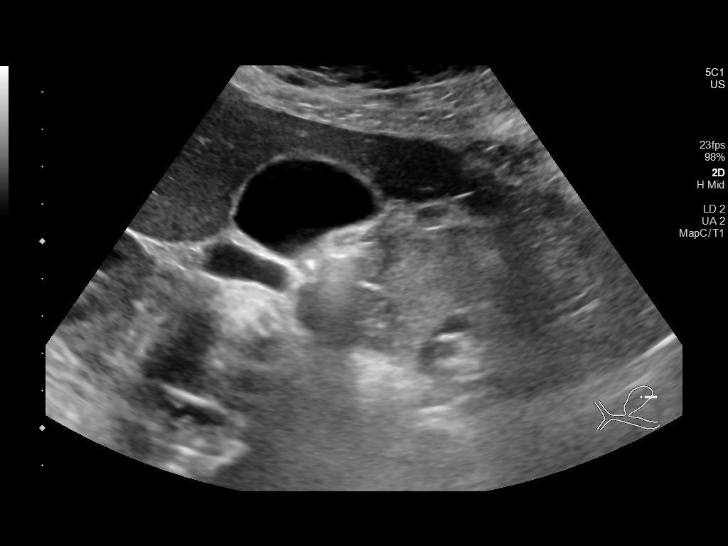
[im 21/45]
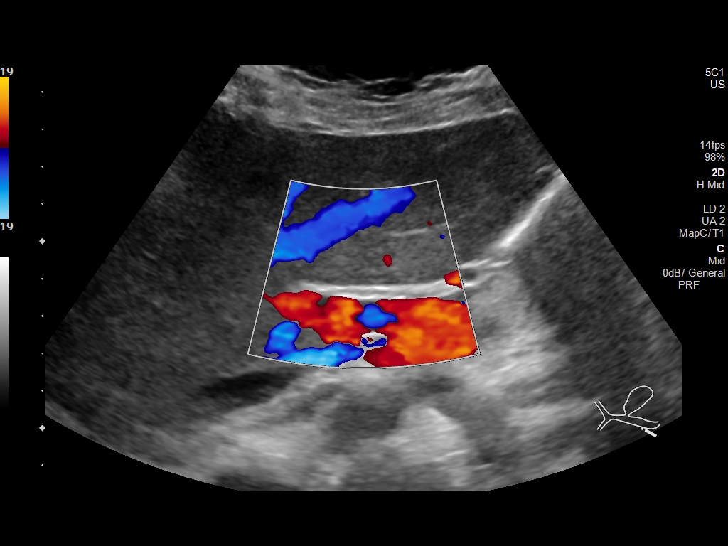
[im 24/45]
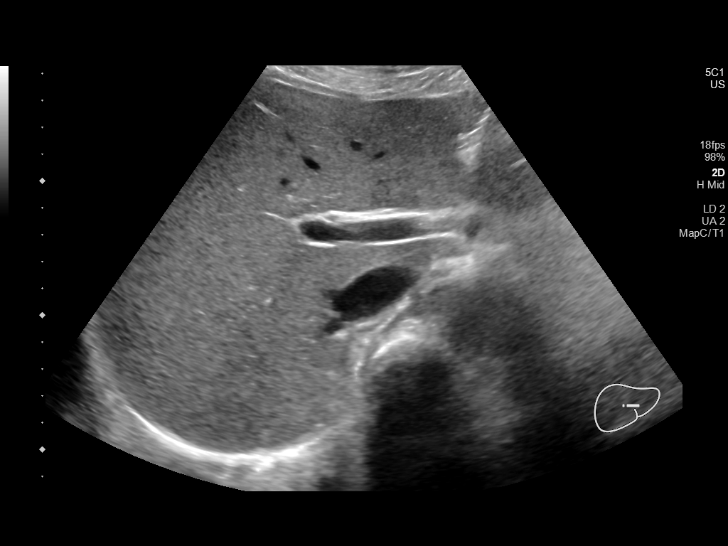
[im 28/45]
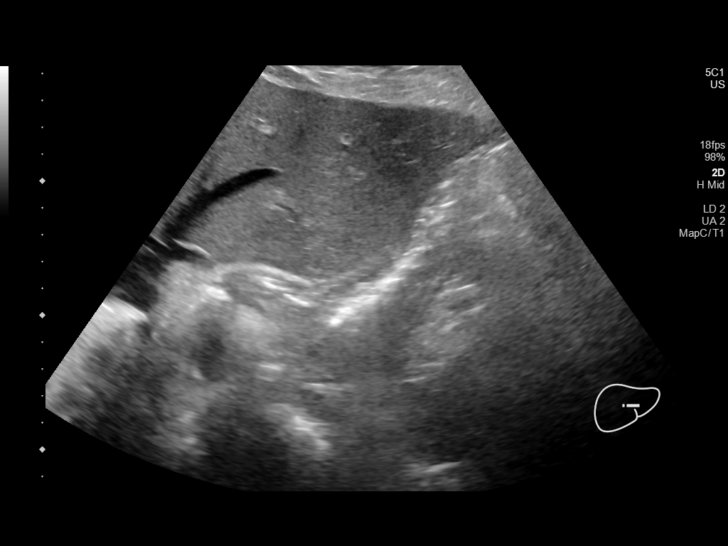
[im 30/45]
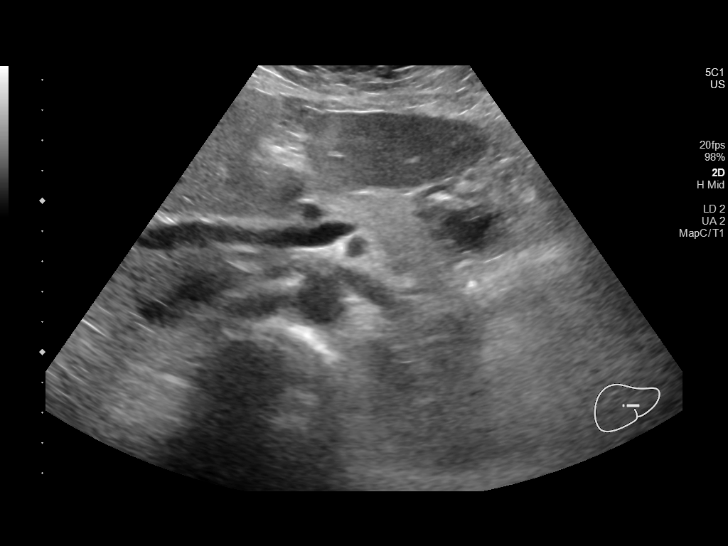
[im 34/45]
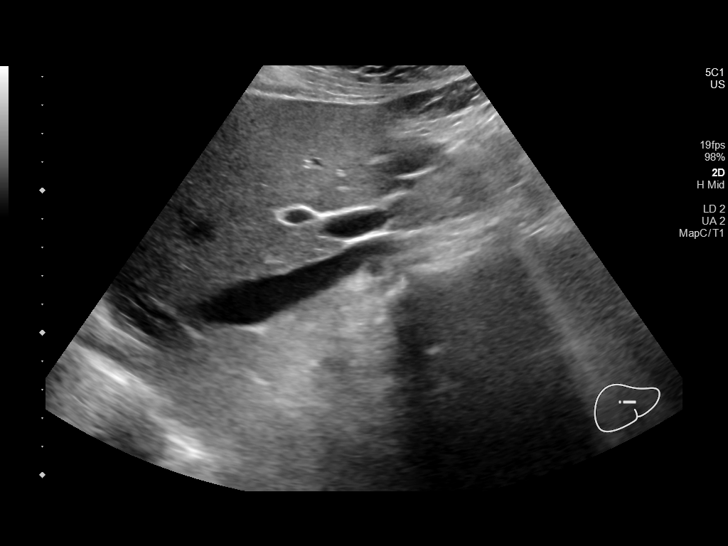
[im 37/45]
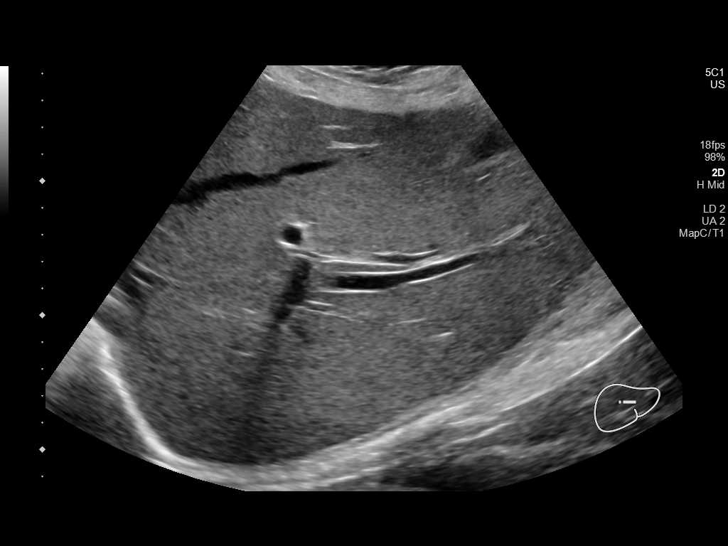
[im 41/45]
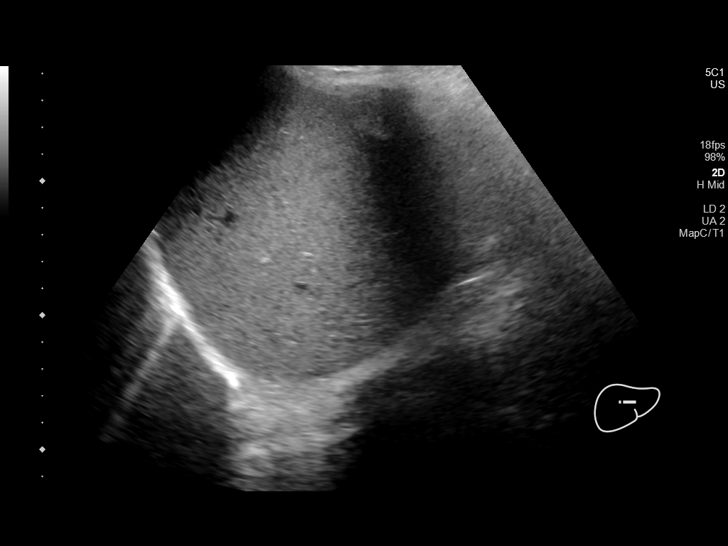
[im 45/45]
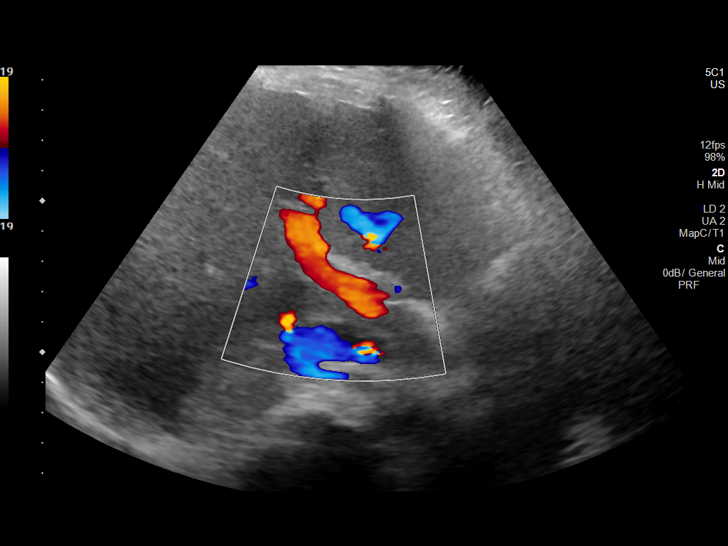

[14 of 25 positions shown; findings below may reference images not displayed]

FINDINGS: Gallbladder:

There is no gallstone, gallbladder wall thickening, or
pericholecystic fluid. Negative sonographic Murphy's sign. There is
a 4 mm gallbladder polyp.

Common bile duct:

Diameter: 2 mm

Liver:

No focal lesion identified. Within normal limits in parenchymal
echogenicity. Portal vein is patent on color Doppler imaging with
normal direction of blood flow towards the liver.

Other: None.
IMPRESSION: A 4 mm gallbladder polyp, otherwise unremarkable right upper
quadrant ultrasound. Follow-up in 6-12 months recommended.

## 2020-12-31 ENCOUNTER — Other Ambulatory Visit: Payer: Self-pay | Admitting: Nurse Practitioner

## 2020-12-31 DIAGNOSIS — Z1231 Encounter for screening mammogram for malignant neoplasm of breast: Secondary | ICD-10-CM

## 2021-02-18 ENCOUNTER — Inpatient Hospital Stay: Admission: RE | Admit: 2021-02-18 | Payer: Medicaid Other | Source: Ambulatory Visit

## 2021-04-11 ENCOUNTER — Ambulatory Visit
Admission: RE | Admit: 2021-04-11 | Discharge: 2021-04-11 | Disposition: A | Discharge status: Home / Self Care | Payer: Medicaid Other | Source: Ambulatory Visit | Attending: Nurse Practitioner | Admitting: Nurse Practitioner

## 2021-04-11 ENCOUNTER — Other Ambulatory Visit: Payer: Self-pay

## 2021-04-11 DIAGNOSIS — Z1231 Encounter for screening mammogram for malignant neoplasm of breast: Secondary | ICD-10-CM

## 2021-07-25 NOTE — Telephone Encounter (Signed)
Mirena rcvd/charged 10/23/2019

## 2021-08-27 IMAGING — MG MM DIGITAL SCREENING BILAT W/ TOMO AND CAD
8 series · 9 of 24 positions shown · non-contrast
Comparison: Previous exam(s).

CLINICAL DATA: Screening.

EXAM:
DIGITAL SCREENING BILATERAL MAMMOGRAM WITH TOMOSYNTHESIS AND CAD
TECHNIQUE: Bilateral screening digital craniocaudal and mediolateral oblique
mammograms were obtained. Bilateral screening digital breast
tomosynthesis was performed. The images were evaluated with
computer-aided detection.

[L CC synth-2D]
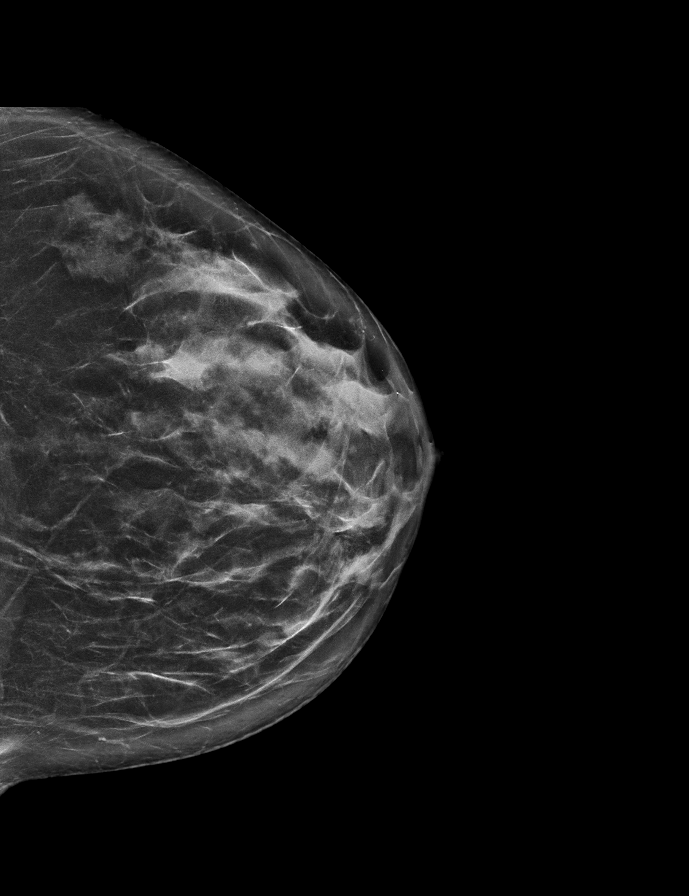

[L MLO synth-2D]
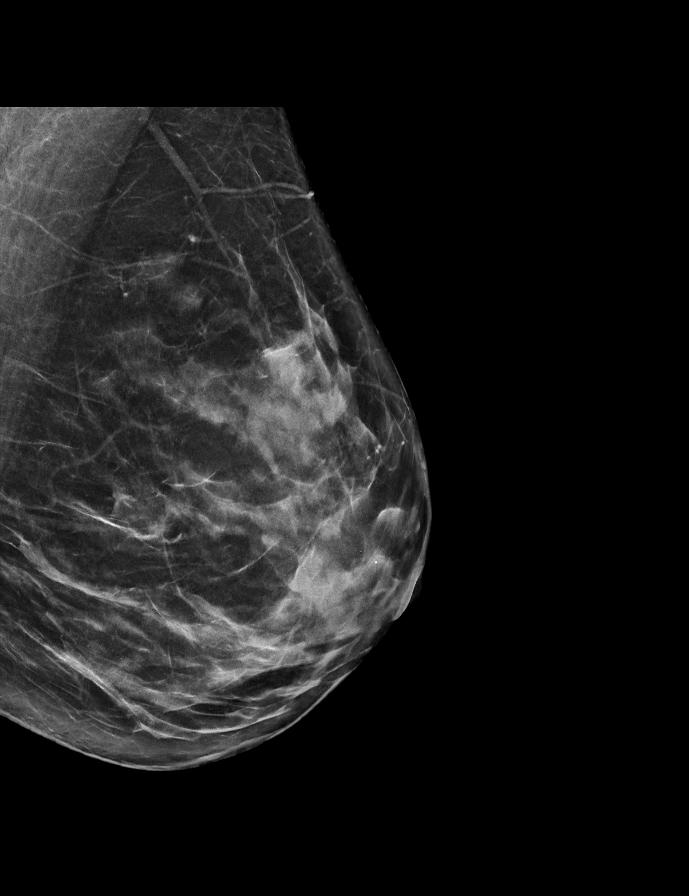

[R CC synth-2D]
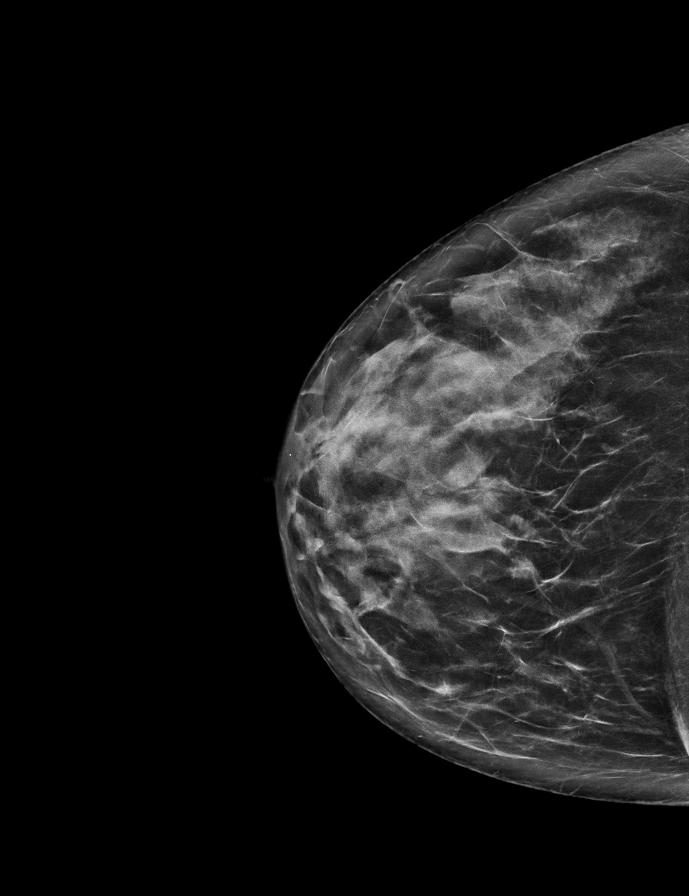

[R MLO synth-2D]
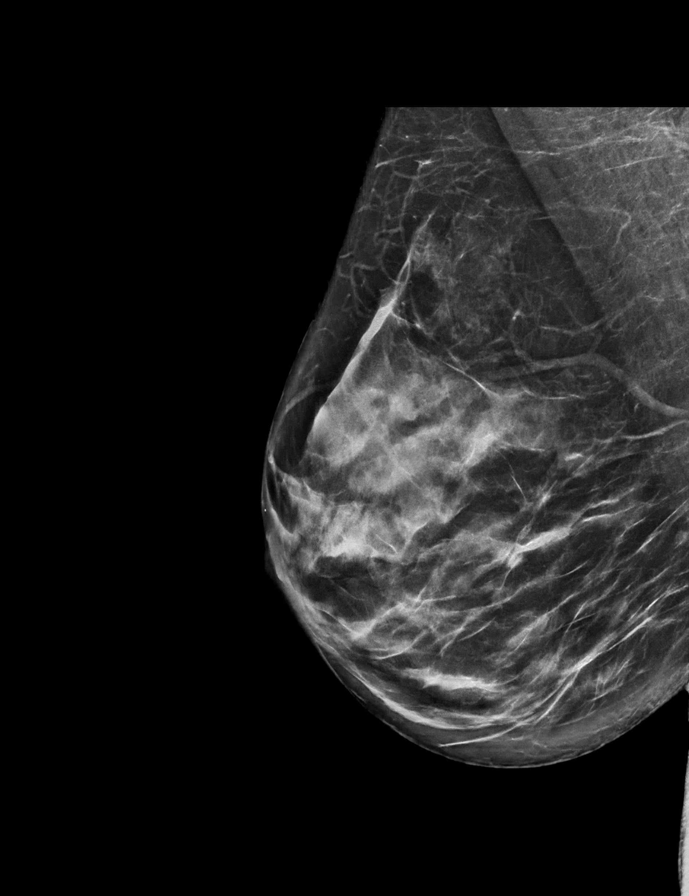

[L CC tomo · 2 of 65 frames shown]
[frame 21/65]
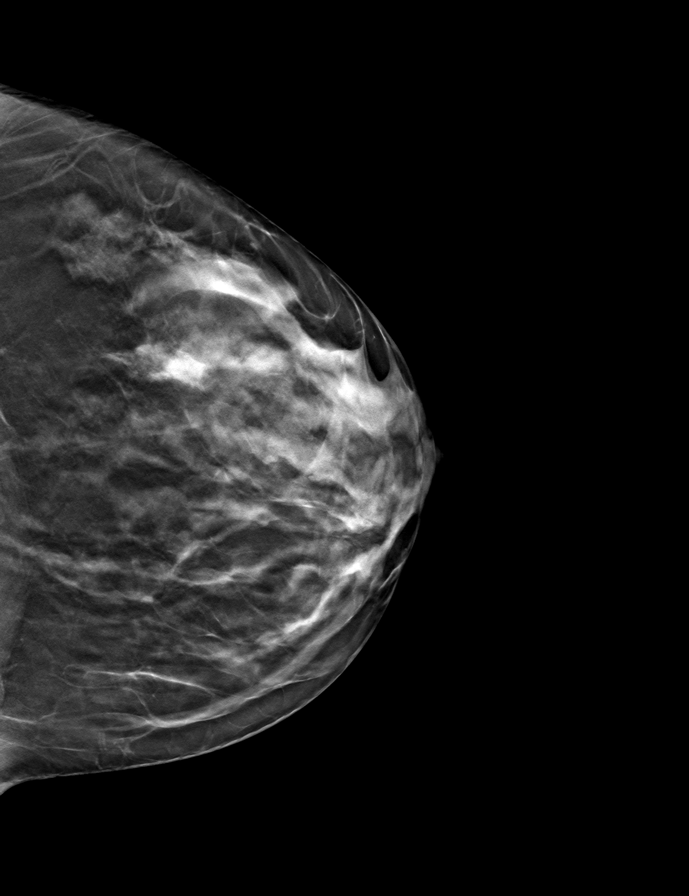
[frame 33/65]
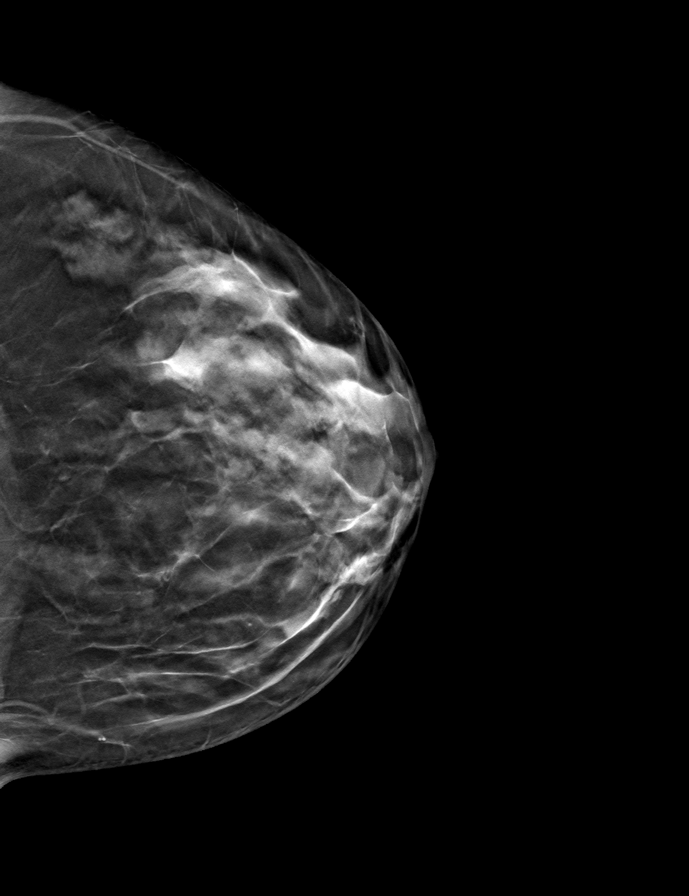

[R CC tomo · tomo slice 35/68.0]
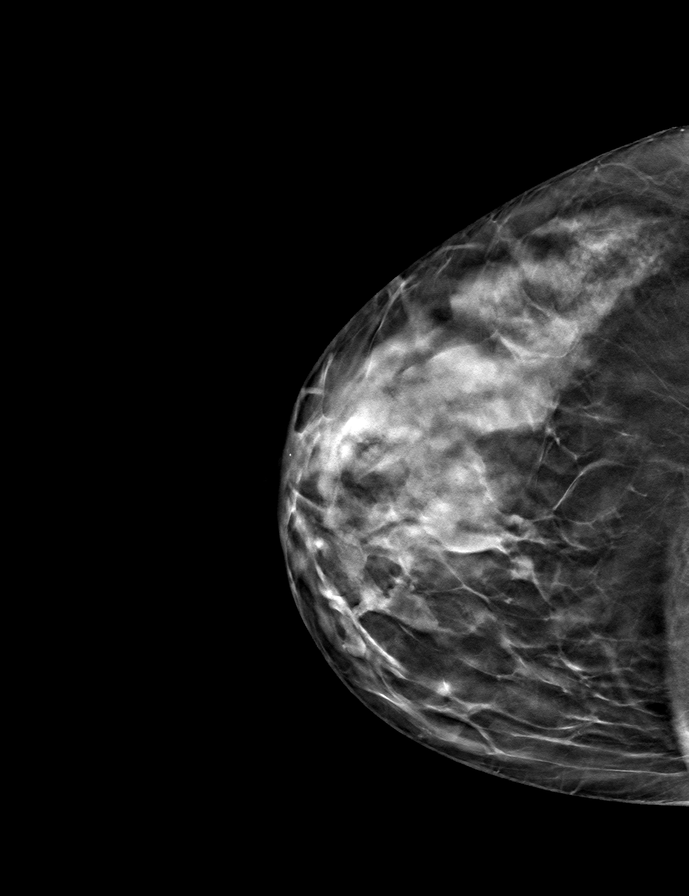

[R MLO tomo · tomo slice 34/67.0]
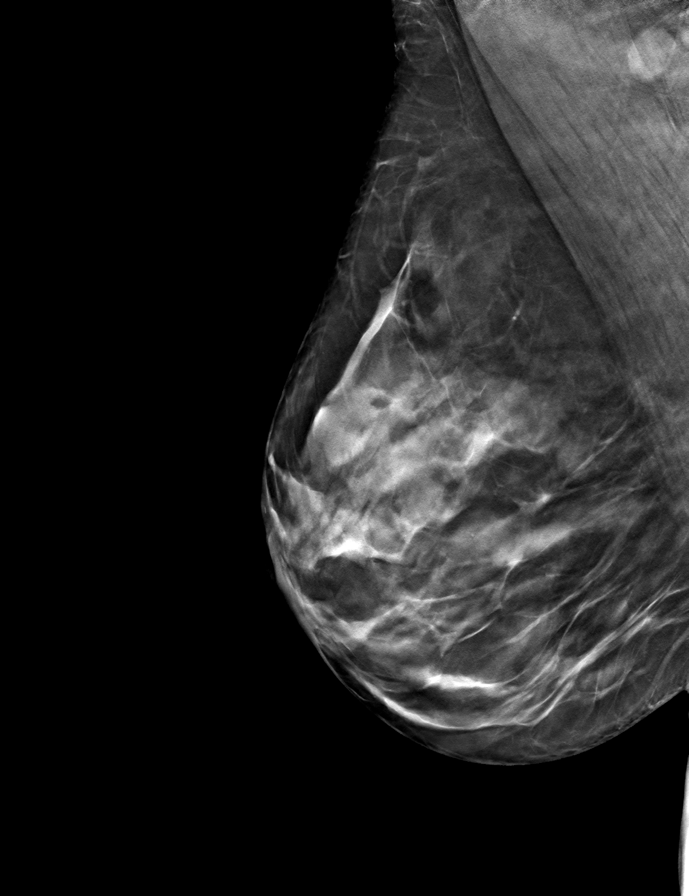

[L MLO tomo · tomo slice 33/65.0]
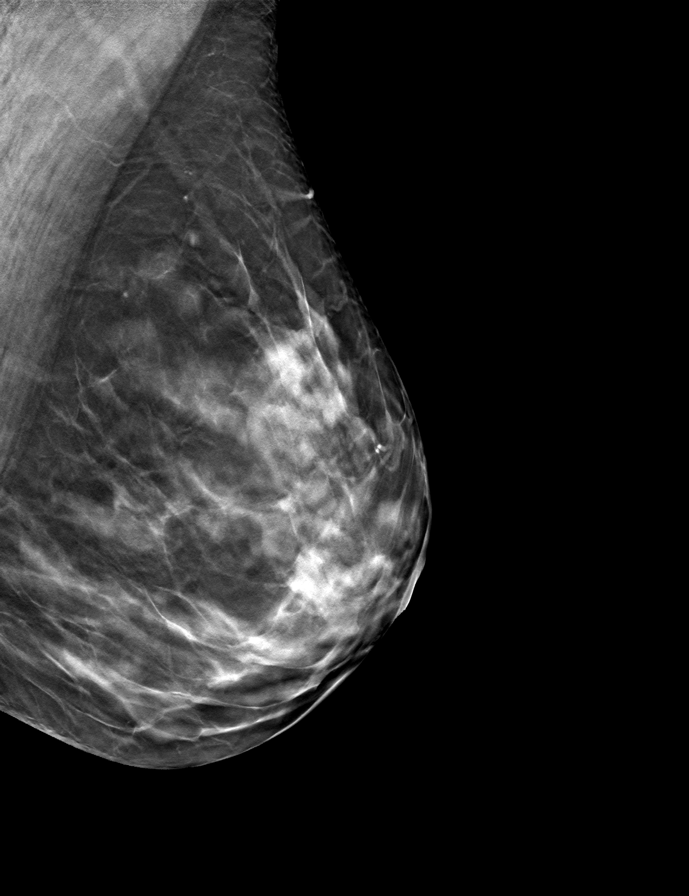

[9 of 24 positions shown; findings below may reference images not displayed]

ACR Breast Density Category c: The breast tissue is heterogeneously
dense, which may obscure small masses.
FINDINGS: There are no findings suspicious for malignancy.
IMPRESSION: No mammographic evidence of malignancy. A result letter of this
screening mammogram will be mailed directly to the patient.

RECOMMENDATION:
Screening mammogram in one year. (Code:[id number])

BI-RADS CATEGORY  1: Negative.

## 2022-09-18 ENCOUNTER — Other Ambulatory Visit: Payer: Self-pay | Admitting: Nurse Practitioner

## 2022-09-18 ENCOUNTER — Ambulatory Visit
Admission: RE | Admit: 2022-09-18 | Discharge: 2022-09-18 | Disposition: A | Discharge status: Home / Self Care | Payer: Medicaid Other | Source: Ambulatory Visit | Attending: Nurse Practitioner | Admitting: Nurse Practitioner

## 2022-09-18 DIAGNOSIS — M79645 Pain in left finger(s): Secondary | ICD-10-CM

## 2022-09-26 LAB — LAB REPORT - SCANNED
A1c: 5.3
EGFR: 68
HM HIV Screening: NEGATIVE

## 2022-10-23 ENCOUNTER — Telehealth: Payer: Self-pay | Admitting: *Deleted

## 2022-10-23 NOTE — Telephone Encounter (Signed)
Patient stated she received a call to schedule NP appt. Please call patient is she has been accepted. Thank you.

## 2022-10-26 NOTE — Telephone Encounter (Signed)
LMOM advising patient that we had not received a referral for her and to reach out with any questions.

## 2023-03-21 NOTE — Progress Notes (Unsigned)
   Office Visit Note  Patient: Cassandra Benitez             Date of Birth: 1981-04-01           MRN: [id number]             PCP: Lance Bosch, NP Referring: Lance Bosch, NP Visit Date: 03/25/2023 Occupation: @GUAROCC @  Subjective:  No chief complaint on file.   History of Present Illness: Cassandra Benitez is a 42 y.o. female ***     Activities of Daily Living:  Patient reports morning stiffness for *** {minute/hour:19697}.   Patient {ACTIONS;DENIES/REPORTS:21021675::"Denies"} nocturnal pain.  Difficulty dressing/grooming: {ACTIONS;DENIES/REPORTS:21021675::"Denies"} Difficulty climbing stairs: {ACTIONS;DENIES/REPORTS:21021675::"Denies"} Difficulty getting out of chair: {ACTIONS;DENIES/REPORTS:21021675::"Denies"} Difficulty using hands for taps, buttons, cutlery, and/or writing: {ACTIONS;DENIES/REPORTS:21021675::"Denies"}  No Rheumatology ROS completed.   PMFS History:  Patient Active Problem List   Diagnosis Date Noted   CAP (community acquired pneumonia) 01/01/2020   Sepsis due to pneumonia (HCC) 01/01/2020   ADHD 01/01/2020   Acute on chronic respiratory failure with hypoxia (HCC) 01/01/2020    Past Medical History:  Diagnosis Date   Kidney stones     No family history on file. Past Surgical History:  Procedure Laterality Date   kidney stones     SHOULDER ARTHROSCOPY WITH ROTATOR CUFF REPAIR  10/2019   Social History   Social History Narrative   Not on file    There is no immunization history on file for this patient.   Objective: Vital Signs: There were no vitals taken for this visit.   Physical Exam   Musculoskeletal Exam: ***  CDAI Exam: CDAI Score: -- Patient Global: --; Provider Global: -- Swollen: --; Tender: -- Joint Exam 03/25/2023   No joint exam has been documented for this visit   There is currently no information documented on the homunculus. Go to the Rheumatology activity and complete the homunculus joint  exam.  Investigation: No additional findings.  Imaging: No results found.  Recent Labs: Lab Results  Component Value Date   WBC 8.8 01/04/2020   HGB 11.1 (L) 01/04/2020   PLT 197 01/04/2020   NA 138 01/04/2020   K 3.6 01/04/2020   CL 106 01/04/2020   CO2 21 (L) 01/04/2020   GLUCOSE 97 01/04/2020   BUN 7 01/04/2020   CREATININE 0.66 01/04/2020   BILITOT 0.4 01/03/2020   ALKPHOS 64 01/03/2020   AST 12 (L) 01/03/2020   ALT 12 01/03/2020   PROT 5.5 (L) 01/03/2020   ALBUMIN 2.6 (L) 01/03/2020   CALCIUM 8.4 (L) 01/04/2020   GFRAA >60 01/04/2020    Speciality Comments: No specialty comments available.  Procedures:  No procedures performed Allergies: Patient has no known allergies.   Assessment / Plan:     Visit Diagnoses: No diagnosis found.  Orders: No orders of the defined types were placed in this encounter.  No orders of the defined types were placed in this encounter.   Face-to-face time spent with patient was *** minutes. Greater than 50% of time was spent in counseling and coordination of care.  Follow-Up Instructions: No follow-ups on file.   Fuller Plan, MD  Note - This record has been created using [employer].  Chart creation errors have been sought, but may not always  have been located. Such creation errors do not reflect on  the standard of medical care.

## 2023-03-25 ENCOUNTER — Encounter: Payer: Self-pay | Admitting: Internal Medicine

## 2023-03-25 ENCOUNTER — Ambulatory Visit: Payer: Medicaid Other | Attending: Internal Medicine | Admitting: Internal Medicine

## 2023-03-25 VITALS — BP 132/86 | HR 66 | Resp 14 | Ht 62.5 in | Wt 133.0 lb

## 2023-03-25 DIAGNOSIS — R768 Other specified abnormal immunological findings in serum: Secondary | ICD-10-CM | POA: Diagnosis not present

## 2023-03-25 DIAGNOSIS — M6740 Ganglion, unspecified site: Secondary | ICD-10-CM | POA: Diagnosis not present

## 2023-03-25 DIAGNOSIS — M19042 Primary osteoarthritis, left hand: Secondary | ICD-10-CM | POA: Diagnosis not present

## 2023-04-06 ENCOUNTER — Other Ambulatory Visit: Payer: Self-pay | Admitting: Nurse Practitioner

## 2023-04-06 DIAGNOSIS — Z1231 Encounter for screening mammogram for malignant neoplasm of breast: Secondary | ICD-10-CM

## 2023-04-14 DIAGNOSIS — Z1231 Encounter for screening mammogram for malignant neoplasm of breast: Secondary | ICD-10-CM

## 2023-04-23 ENCOUNTER — Inpatient Hospital Stay: Admission: RE | Admit: 2023-04-23 | Payer: Medicaid Other | Source: Ambulatory Visit

## 2023-04-23 DIAGNOSIS — Z1231 Encounter for screening mammogram for malignant neoplasm of breast: Secondary | ICD-10-CM
